# Patient Record
Sex: Female | Born: 1955 | Race: White | Hispanic: No | Marital: Married | Smoking: Never smoker
Health system: Southern US, Community
[De-identification: ages and names within clinical notes are randomized; demographics above are authoritative.]

## PROBLEM LIST (undated history)

## (undated) HISTORY — PX: NASAL SEPTUM SURGERY: SHX37

## (undated) HISTORY — PX: HERNIA REPAIR: SHX51

---

## 2014-05-23 DIAGNOSIS — I10 Essential (primary) hypertension: Secondary | ICD-10-CM | POA: Insufficient documentation

## 2015-09-09 DIAGNOSIS — S32030D Wedge compression fracture of third lumbar vertebra, subsequent encounter for fracture with routine healing: Secondary | ICD-10-CM | POA: Insufficient documentation

## 2015-10-30 DIAGNOSIS — S32000A Wedge compression fracture of unspecified lumbar vertebra, initial encounter for closed fracture: Secondary | ICD-10-CM | POA: Insufficient documentation

## 2016-05-20 DIAGNOSIS — E7841 Elevated Lipoprotein(a): Secondary | ICD-10-CM | POA: Insufficient documentation

## 2018-05-11 DIAGNOSIS — R931 Abnormal findings on diagnostic imaging of heart and coronary circulation: Secondary | ICD-10-CM | POA: Insufficient documentation

## 2018-09-21 DIAGNOSIS — M810 Age-related osteoporosis without current pathological fracture: Secondary | ICD-10-CM | POA: Insufficient documentation

## 2019-09-14 NOTE — Progress Notes (Signed)
Scheduled to complete physical 09/21/19.  (Provider TBD)  AMD 

## 2019-09-15 ENCOUNTER — Other Ambulatory Visit: Payer: Self-pay

## 2019-09-15 ENCOUNTER — Ambulatory Visit: Payer: Self-pay

## 2019-09-15 DIAGNOSIS — Z0000 Encounter for general adult medical examination without abnormal findings: Secondary | ICD-10-CM

## 2019-09-15 LAB — POCT URINALYSIS DIPSTICK
Bilirubin, UA: NEGATIVE
Blood, UA: NEGATIVE
Glucose, UA: NEGATIVE
Ketones, UA: NEGATIVE
Leukocytes, UA: NEGATIVE
Nitrite, UA: NEGATIVE
Protein, UA: NEGATIVE
Spec Grav, UA: 1.01 (ref 1.010–1.025)
Urobilinogen, UA: 0.2 E.U./dL
pH, UA: 7.5 (ref 5.0–8.0)

## 2019-09-16 LAB — CMP12+LP+TP+TSH+6AC+CBC/D/PLT
ALT: 20 IU/L (ref 0–32)
AST: 23 IU/L (ref 0–40)
Albumin/Globulin Ratio: 2 (ref 1.2–2.2)
Albumin: 4.6 g/dL (ref 3.8–4.8)
Alkaline Phosphatase: 75 IU/L (ref 39–117)
BUN/Creatinine Ratio: 18 (ref 12–28)
BUN: 14 mg/dL (ref 8–27)
Basophils Absolute: 0.1 10*3/uL (ref 0.0–0.2)
Basos: 1 %
Bilirubin Total: 0.5 mg/dL (ref 0.0–1.2)
Calcium: 9.7 mg/dL (ref 8.7–10.3)
Chloride: 102 mmol/L (ref 96–106)
Chol/HDL Ratio: 2 ratio (ref 0.0–4.4)
Cholesterol, Total: 167 mg/dL (ref 100–199)
Creatinine, Ser: 0.76 mg/dL (ref 0.57–1.00)
EOS (ABSOLUTE): 0.1 10*3/uL (ref 0.0–0.4)
Eos: 1 %
Estimated CHD Risk: <0.5 times avg. (ref 0.0–1.0)
Free Thyroxine Index: 1.6 (ref 1.2–4.9)
GFR calc Af Amer: 97 mL/min/{1.73_m2} (ref >59)
GFR calc non Af Amer: 84 mL/min/{1.73_m2} (ref >59)
GGT: 9 IU/L (ref 0–60)
Globulin, Total: 2.3 g/dL (ref 1.5–4.5)
Glucose: 85 mg/dL (ref 65–99)
HDL: 85 mg/dL (ref >39)
Hematocrit: 45.1 % (ref 34.0–46.6)
Hemoglobin: 15.3 g/dL (ref 11.1–15.9)
Immature Grans (Abs): 0 10*3/uL (ref 0.0–0.1)
Immature Granulocytes: 0 %
Iron: 101 ug/dL (ref 27–139)
LDH: 182 IU/L (ref 119–226)
LDL Chol Calc (NIH): 69 mg/dL (ref 0–99)
Lymphocytes Absolute: 1.1 10*3/uL (ref 0.7–3.1)
Lymphs: 20 %
MCH: 33.8 pg — ABNORMAL HIGH (ref 26.6–33.0)
MCHC: 33.9 g/dL (ref 31.5–35.7)
MCV: 100 fL — ABNORMAL HIGH (ref 79–97)
Monocytes Absolute: 0.4 10*3/uL (ref 0.1–0.9)
Monocytes: 7 %
Neutrophils Absolute: 4 10*3/uL (ref 1.4–7.0)
Neutrophils: 71 %
Phosphorus: 3.3 mg/dL (ref 3.0–4.3)
Platelets: 238 10*3/uL (ref 150–450)
Potassium: 4.9 mmol/L (ref 3.5–5.2)
RBC: 4.53 x10E6/uL (ref 3.77–5.28)
RDW: 11.2 % — ABNORMAL LOW (ref 11.7–15.4)
Sodium: 142 mmol/L (ref 134–144)
T3 Uptake Ratio: 26 % (ref 24–39)
T4, Total: 6 ug/dL (ref 4.5–12.0)
TSH: 1.5 u[IU]/mL (ref 0.450–4.500)
Total Protein: 6.9 g/dL (ref 6.0–8.5)
Triglycerides: 69 mg/dL (ref 0–149)
Uric Acid: 4.5 mg/dL (ref 3.0–7.2)
VLDL Cholesterol Cal: 13 mg/dL (ref 5–40)
WBC: 5.6 10*3/uL (ref 3.4–10.8)

## 2019-09-20 DIAGNOSIS — IMO0001 Reserved for inherently not codable concepts without codable children: Secondary | ICD-10-CM | POA: Insufficient documentation

## 2019-09-26 ENCOUNTER — Encounter: Payer: Self-pay | Admitting: Physician Assistant

## 2019-09-26 ENCOUNTER — Ambulatory Visit: Payer: Self-pay | Admitting: Physician Assistant

## 2019-09-26 ENCOUNTER — Other Ambulatory Visit: Payer: Self-pay

## 2019-09-26 VITALS — BP 127/89 | HR 68 | Temp 98.0°F | Resp 12 | Ht 64.0 in | Wt 123.0 lb

## 2019-09-26 DIAGNOSIS — Z0000 Encounter for general adult medical examination without abnormal findings: Secondary | ICD-10-CM

## 2019-09-26 NOTE — Progress Notes (Signed)
   Subjective: Annual physical exam    Patient ID: Brandi Roach, female    DOB: 1955/08/23, 64 y.o.   MRN: [id number]  HPI Patient presents annual physical exam.  Patient is also no concerns or complaints.   Review of Systems    Hyperlipidemia and hypertension. Objective:   Physical Exam Patient appears no acute distress.  HEENT was unremarkable.  Neck is supple without adenopathy or bruits.  Lungs are clear to auscultation.  Heart regular rate and rhythm.  Abdomen negative HSM, normoactive bowel sounds, soft, and nontender palpation.  No obvious deformity to the upper or lower extremities.  Patient has full equal range of motion upper and lower extremities.  No obvious cervical or lumbar spine deformity.  Patient has full equal range of motion cervical lumbar spine.  Cranial nerves II through XII grossly intact.     Assessment & Plan: Well exam.  Discussed patient EKG and lab results which are unremarkable.  Patient advised continue previous medication follow-up in 1 year.

## 2020-02-29 DIAGNOSIS — N8110 Cystocele, unspecified: Secondary | ICD-10-CM | POA: Insufficient documentation

## 2020-02-29 DIAGNOSIS — N952 Postmenopausal atrophic vaginitis: Secondary | ICD-10-CM | POA: Insufficient documentation

## 2021-05-22 ENCOUNTER — Other Ambulatory Visit: Payer: Self-pay

## 2021-05-22 ENCOUNTER — Ambulatory Visit
Admission: RE | Admit: 2021-05-22 | Discharge: 2021-05-22 | Disposition: A | Discharge status: Home / Self Care | Payer: Self-pay | Attending: Physician Assistant | Admitting: Physician Assistant

## 2021-05-22 ENCOUNTER — Ambulatory Visit
Admission: RE | Admit: 2021-05-22 | Discharge: 2021-05-22 | Disposition: A | Discharge status: Home / Self Care | Payer: Self-pay | Source: Ambulatory Visit | Attending: Physician Assistant | Admitting: Physician Assistant

## 2021-05-22 ENCOUNTER — Ambulatory Visit: Payer: Self-pay | Admitting: Physician Assistant

## 2021-05-22 ENCOUNTER — Encounter: Payer: Self-pay | Admitting: Physician Assistant

## 2021-05-22 VITALS — BP 175/100 | HR 86 | Temp 97.7°F | Resp 12 | Ht 64.0 in | Wt 123.0 lb

## 2021-05-22 DIAGNOSIS — M5440 Lumbago with sciatica, unspecified side: Secondary | ICD-10-CM

## 2021-05-22 MED ORDER — CYCLOBENZAPRINE HCL 5 MG PO TABS: 5.0000 mg | ORAL_TABLET | Freq: Three times a day (TID) | ORAL | 1 refills | Status: AC | PRN

## 2021-05-22 MED ORDER — MELOXICAM 7.5 MG PO TABS
7.5000 mg | ORAL_TABLET | Freq: Every day | ORAL | 0 refills | Status: AC
Start: 2021-05-22 — End: ?

## 2021-05-22 NOTE — Progress Notes (Signed)
Pt experiencing joint pain in hip. Pt believes she's having sciatica pain on left side radiating down left leg.

## 2021-05-22 NOTE — Progress Notes (Signed)
° °  Subjective: Radicular back pain    Patient ID: Brandi Roach, female    DOB: 08-20-1955, 66 y.o.   MRN: [id number]  HPI Patient presents with radicular back pain to the left lower extremity has increased in the past week.  Patient denies bladder or bowel dysfunction.  Patient has a history of osteoporosis which was treated with Reclast infusion 2 years ago.  Patient also with diagnosis of compression fracture in 2021.  Last bone density was May 2021.  Patient states complaint increased from a sitting to a standing position.  Stated numbness initially went to the posterior knee involves the total left lower extremity.  Able to ambulate with mild discomfort.  Review of Systems Essential tremors, hyperlipidemia, hypertension, and osteoporosis.    Objective:   Physical Exam  No acute distress.  See nurses note for vital signs No obvious spinal deformity.  Sits and stands without have reliance on upper extremities.  Mild guarding palpation lumbar spinal processes L3-L5.  Negative bilateral straight leg test..      Assessment & Plan: Radicular back pain.  Lidoderm patch applied prior to departure.  Patient get a prescription for 5 mg of Flexeril and 7.5 mg meloxicam.  I will obtain imagings via x-ray of the lumbar spine and follow-up in 5 days.  Advised to go to the emergency room if condition worsens.

## 2021-05-27 ENCOUNTER — Encounter: Payer: Self-pay | Admitting: Physician Assistant

## 2021-05-27 ENCOUNTER — Other Ambulatory Visit: Payer: Self-pay

## 2021-05-27 ENCOUNTER — Ambulatory Visit: Payer: Self-pay | Admitting: Physician Assistant

## 2021-05-27 VITALS — BP 145/95 | HR 90 | Temp 97.7°F | Resp 12 | Ht 64.0 in | Wt 123.0 lb

## 2021-05-27 DIAGNOSIS — M5440 Lumbago with sciatica, unspecified side: Secondary | ICD-10-CM

## 2021-05-27 NOTE — Progress Notes (Signed)
Pt presents today for follow up on si pain with xray results. Pt states the bottom of left numb/prickling. The shooting pain isn't as bad depending on how she sits.

## 2021-05-27 NOTE — Progress Notes (Signed)
° °  Subjective: Radicular back pain    Patient ID: Brandi Roach, female    DOB: [birth year], 66 y.o.   MRN: [id number]  HPI Patient here today for follow-up radicular back pain to the left lower extremity.  Patient stated pain increased when moving from a sitting to standing position or walking upstairs.  Denies bladder or bowel dysfunction.  Rates pain as a 5/10.  Patient was given a prescription for low-dose Flexeril and meloxicam.  Patient states she was afraid to take the meloxicam after reading about the side effects.  Patient states she used Flexeril after playing tennis.  Further history reveals patient has a compression fracture of L3 on L4 5 years ago.  X-rays taken last weeks shows moderate superior endplate compression fracture at L3.  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   Review of Systems Essential and whitecoat hypertension.  Essential tremors.  Compression fracture with routine healing of L3 and osteoporosis.    Objective:   Physical Exam  No acute distress.  Temperature 97.7, pulse 90, BP 145/95, respiration 12, patient weighs 123 pounds and BMI is 21.11. Patient sits and stands all have reliance on upper extremity.  Patient states that when she stands up this increase numbness sensation in the back of her leg to her feet of the left lower extremity.  Patient had a negative straight leg test in the supine position.      Assessment & Plan: Radicular back pain to left lower extremity.  Advised patient continue conservative treatment.  We will consult to orthopedics to consider physical therapy.

## 2021-06-23 ENCOUNTER — Other Ambulatory Visit: Payer: Self-pay

## 2021-06-23 ENCOUNTER — Ambulatory Visit: Payer: Self-pay

## 2021-06-23 DIAGNOSIS — Z0000 Encounter for general adult medical examination without abnormal findings: Secondary | ICD-10-CM

## 2021-06-23 LAB — POCT URINALYSIS DIPSTICK
Bilirubin, UA: NEGATIVE
Blood, UA: NEGATIVE
Clarity, UA: NEGATIVE
Glucose, UA: NEGATIVE
Ketones, UA: NEGATIVE
Leukocytes, UA: NEGATIVE
Nitrite, UA: NEGATIVE
Protein, UA: NEGATIVE
Spec Grav, UA: 1.01 (ref 1.010–1.025)
Urobilinogen, UA: 0.2 E.U./dL
pH, UA: 7.5 (ref 5.0–8.0)

## 2021-06-23 NOTE — Progress Notes (Signed)
06/30/21 annual physical scheduled.

## 2021-06-24 LAB — CMP12+LP+TP+TSH+6AC+CBC/D/PLT
ALT: 31 IU/L (ref 0–32)
AST: 28 IU/L (ref 0–40)
Albumin/Globulin Ratio: 1.9 (ref 1.2–2.2)
Albumin: 5 g/dL — ABNORMAL HIGH (ref 3.8–4.8)
Alkaline Phosphatase: 78 IU/L (ref 44–121)
BUN/Creatinine Ratio: 22 (ref 12–28)
BUN: 17 mg/dL (ref 8–27)
Basophils Absolute: 0.1 x10E3/uL (ref 0.0–0.2)
Basos: 1 %
Bilirubin Total: 0.5 mg/dL (ref 0.0–1.2)
Calcium: 9.7 mg/dL (ref 8.7–10.3)
Chloride: 102 mmol/L (ref 96–106)
Chol/HDL Ratio: 2 ratio (ref 0.0–4.4)
Cholesterol, Total: 193 mg/dL (ref 100–199)
Creatinine, Ser: 0.79 mg/dL (ref 0.57–1.00)
EOS (ABSOLUTE): 0.1 x10E3/uL (ref 0.0–0.4)
Eos: 2 %
Free Thyroxine Index: 1.5 (ref 1.2–4.9)
GGT: 9 IU/L (ref 0–60)
Globulin, Total: 2.7 g/dL (ref 1.5–4.5)
Glucose: 83 mg/dL (ref 70–99)
HDL: 95 mg/dL
Hematocrit: 45.8 % (ref 34.0–46.6)
Hemoglobin: 16 g/dL — ABNORMAL HIGH (ref 11.1–15.9)
Immature Grans (Abs): 0 x10E3/uL (ref 0.0–0.1)
Immature Granulocytes: 0 %
Iron: 86 ug/dL (ref 27–139)
LDH: 205 IU/L (ref 119–226)
LDL Chol Calc (NIH): 85 mg/dL (ref 0–99)
Lymphocytes Absolute: 1.3 x10E3/uL (ref 0.7–3.1)
Lymphs: 23 %
MCH: 33.5 pg — ABNORMAL HIGH (ref 26.6–33.0)
MCHC: 34.9 g/dL (ref 31.5–35.7)
MCV: 96 fL (ref 79–97)
Monocytes Absolute: 0.5 x10E3/uL (ref 0.1–0.9)
Monocytes: 9 %
Neutrophils Absolute: 3.8 x10E3/uL (ref 1.4–7.0)
Neutrophils: 65 %
Phosphorus: 3.4 mg/dL (ref 3.0–4.3)
Platelets: 256 x10E3/uL (ref 150–450)
Potassium: 4 mmol/L (ref 3.5–5.2)
RBC: 4.77 x10E6/uL (ref 3.77–5.28)
RDW: 11.9 % (ref 11.7–15.4)
Sodium: 143 mmol/L (ref 134–144)
T3 Uptake Ratio: 22 % — ABNORMAL LOW (ref 24–39)
T4, Total: 6.8 ug/dL (ref 4.5–12.0)
TSH: 2.06 u[IU]/mL (ref 0.450–4.500)
Total Protein: 7.7 g/dL (ref 6.0–8.5)
Triglycerides: 70 mg/dL (ref 0–149)
Uric Acid: 4.5 mg/dL (ref 3.0–7.2)
VLDL Cholesterol Cal: 13 mg/dL (ref 5–40)
WBC: 5.7 x10E3/uL (ref 3.4–10.8)
eGFR: 83 mL/min/1.73

## 2021-06-30 ENCOUNTER — Ambulatory Visit: Payer: Self-pay | Admitting: Physician Assistant

## 2021-06-30 ENCOUNTER — Other Ambulatory Visit: Payer: Self-pay

## 2021-06-30 ENCOUNTER — Encounter: Payer: Self-pay | Admitting: Physician Assistant

## 2021-06-30 VITALS — BP 130/80 | Temp 97.1°F | Resp 12 | Ht 64.0 in | Wt 129.0 lb

## 2021-06-30 DIAGNOSIS — Z0000 Encounter for general adult medical examination without abnormal findings: Secondary | ICD-10-CM

## 2021-06-30 NOTE — Progress Notes (Signed)
[facility]  ____________________________________________   None    (approximate)  I have reviewed the triage vital signs and the nursing notes.   HISTORY  Chief Complaint Annual Exam    HPI Brandi Roach is a 66 y.o. female patient presents for annual physical exam voices no concerns or complaints. Past medical history markable for osteoporosis compression fracture  L3, hypertension, and essential tremors.         No past medical history on file.  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 Authorizing Provider  atorvastatin (LIPITOR) 80 MG tablet Take 80 mg by mouth daily. 05/16/21  Yes [provider]  Cholecalciferol (VITAMIN D) 50 MCG (2000 UT) tablet Take 2,000 Units by mouth daily.   Yes [provider]  lisinopril (ZESTRIL) 5 MG tablet Take by mouth. 10/28/18  Yes [provider]  aspirin 81 MG EC tablet Take 1 tablet by mouth daily. Patient not taking: Reported on 06/30/2021 07/17/20 07/17/21  [provider]  Cholecalciferol 10 MCG (400 UNIT) CAPS  09/10/19   [provider]  cyclobenzaprine (FLEXERIL) 5 MG tablet Take 1 tablet (5 mg total) by mouth 3 (three) times daily as needed for muscle spasms. Patient not taking: Reported on 06/30/2021 05/22/21   Sable Feil, PA-C  estradiol (ESTRACE) 0.1 MG/GM vaginal cream PLACE 1 GRAM VAGINALLY 2 TIMES A WEEK 07/02/20    [provider]  fluorouracil (EFUDEX) 5 % cream Apply topically.    [provider]  meloxicam (MOBIC) 7.5 MG tablet Take 1 tablet (7.5 mg total) by mouth daily. Patient not taking: Reported on 06/30/2021 05/22/21   Sable Feil, PA-C    Allergies Patient has no known allergies.  No family history on file.  Social History Social History   Tobacco Use   Smoking status: Never   Smokeless tobacco: Never    Review of Systems Constitutional: No fever/chills Eyes: No visual changes. ENT: No sore throat. Cardiovascular: Denies chest pain. Respiratory: Denies shortness of breath. Gastrointestinal: No abdominal pain.  No nausea, no vomiting.  No diarrhea.  No constipation. Genitourinary: Negative for dysuria. Musculoskeletal: Negative for back pain. Skin: Negative for rash. Neurological: Negative for headaches, focal weakness or numbness.  Essential tremors Psychiatric:  Endocrine: Hypertension  ____________________________________________   PHYSICAL EXAM:  VITAL SIGNS: Temperature 97.1, respiration 12, BP is 130/80, and patient is 96% O2 sat on room air.  Patient weighs 129 pounds. Constitutional: Alert and oriented. Well appearing and in no acute distress. Eyes: Conjunctivae are normal. PERRL. EOMI. Head: Atraumatic. Nose: No congestion/rhinnorhea. Mouth/Throat: Mucous membranes are moist.  Oropharynx non-erythematous. Neck: No stridor.  No cervical spine tenderness to palpation. Hematological/Lymphatic/Immunilogical: No cervical lymphadenopathy. Cardiovascular: Normal rate, regular rhythm. Grossly normal heart sounds.  Good peripheral circulation. Respiratory: Normal respiratory effort.  No retractions. Lungs CTAB. Gastrointestinal: Soft and nontender. No distention. No abdominal bruits. No CVA tenderness. Genitourinary:  Musculoskeletal: No  lower extremity tenderness nor edema.  No joint effusions. Neurologic:  Normal speech and language. No gross  focal neurologic deficits are appreciated. No gait instability. Skin:  Skin is warm, dry and intact. No rash noted. Psychiatric: Mood and affect are normal. Speech and behavior are normal.  ____________________________________________   LABS _________      Component Ref Range & Units 7 d ago 1 yr ago  Color, UA  yellow VC  Light Yellow   Clarity, UA  negative  Clear   Glucose, UA Negative Negative  Negative   Bilirubin, UA  negative  Negative   Ketones, UA  negative  Negative   Spec Grav, UA 1.010 - 1.025 1.010 VC  1.010   Blood, UA  negative  Negative   pH, UA 5.0 - 8.0 7.5 VC  7.5   Protein, UA Negative Negative  Negative   Urobilinogen, UA 0.2 or 1.0 E.U./dL 0.2  0.2   Nitrite, UA  negative  Negative   Leukocytes, UA Negative Negative  Negative   Appearance  light VC     Odor             Specimen Collected: 06/23/21 08:50 Last Resulted: 06/23/21 08:50      Lab Flowsheet    Order Details    View Encounter    Lab and Collection Details    Routing    Result History    View Encounter Conversation      VC=Value has a corrected status       Result Care Coordination   Patient Communication   Add Comments   Seen Back to Top       Other Results from 06/23/2021   Contains abnormal data CMP12+LP+TP+TSH+6AC+CBC/D/Plt Order: [id number] Status: Final result    Visible to patient: Yes (seen)    Next appt: None    Dx: Routine adult health maintenance    0 Result Notes      Component Ref Range & Units 7 d ago 1 yr ago  Glucose 70 - 99 mg/dL 83  85 R   Uric Acid 3.0 - 7.2 mg/dL 4.5  4.5 CM   Comment:            Therapeutic target for gout patients: <6.0  BUN 8 - 27 mg/dL 17  14   Creatinine, Ser 0.57 - 1.00 mg/dL 0.79  0.76   eGFR >59 mL/min/1.73 83    BUN/Creatinine Ratio 12 - 28 22  18    Sodium 134 - 144 mmol/L 143  142   Potassium 3.5 - 5.2 mmol/L 4.0  4.9   Chloride 96 - 106 mmol/L 102  102   Calcium 8.7 - 10.3 mg/dL 9.7  9.7   Phosphorus 3.0 - 4.3 mg/dL 3.4   3.3   Total Protein 6.0 - 8.5 g/dL 7.7  6.9   Albumin 3.8 - 4.8 g/dL 5.0 High   4.6   Globulin, Total 1.5 - 4.5 g/dL 2.7  2.3   Albumin/Globulin Ratio 1.2 - 2.2 1.9  2.0   Bilirubin Total 0.0 - 1.2 mg/dL 0.5  0.5   Alkaline Phosphatase 44 - 121 IU/L 78  75 R   LDH 119 - 226 IU/L 205  182   AST 0 - 40 IU/L 28  23   ALT 0 - 32 IU/L 31  20   GGT 0 - 60 IU/L 9  9   Iron 27 - 139 ug/dL 86  101   Cholesterol, Total 100 - 199 mg/dL  193  167   Triglycerides 0 - 149 mg/dL 70  69   HDL >39 mg/dL 95  85   VLDL Cholesterol Cal 5 - 40 mg/dL 13  13   LDL Chol Calc (NIH) 0 - 99 mg/dL 85  69   Chol/HDL Ratio 0.0 - 4.4 ratio 2.0  2.0 CM   Comment:                                   T. Chol/HDL Ratio                                              Men  Women                                1/2 Avg.Risk  3.4    3.3                                    Avg.Risk  5.0    4.4                                 2X Avg.Risk  9.6    7.1                                 3X Avg.Risk 23.4   11.0   Estimated CHD Risk 0.0 - 1.0 times avg.  < 0.5   < 0.5 CM   Comment: The CHD Risk is based on the T. Chol/HDL ratio. Other  factors affect CHD Risk such as hypertension, smoking,  diabetes, severe obesity, and family history of  premature CHD.   TSH 0.450 - 4.500 uIU/mL 2.060  1.500   T4, Total 4.5 - 12.0 ug/dL 6.8  6.0   T3 Uptake Ratio 24 - 39 % 22 Low   26   Free Thyroxine Index 1.2 - 4.9 1.5  1.6   WBC 3.4 - 10.8 x10E3/uL 5.7  5.6   RBC 3.77 - 5.28 x10E6/uL 4.77  4.53   Hemoglobin 11.1 - 15.9 g/dL 16.0 High   15.3   Hematocrit 34.0 - 46.6 % 45.8  45.1   MCV 79 - 97 fL 96  100 High    MCH 26.6 - 33.0 pg 33.5 High   33.8 High    MCHC 31.5 - 35.7 g/dL 34.9  33.9   RDW 11.7 - 15.4 % 11.9  11.2 Low    Platelets 150 - 450 x10E3/uL 256  238   Neutrophils Not Estab. % 65  71   Lymphs Not Estab. % 23  20   Monocytes Not Estab. % 9  7   Eos Not Estab. % 2  1   Basos Not Estab. % 1  1   Neutrophils Absolute 1.4 - 7.0  x10E3/uL 3.8  4.0   Lymphocytes Absolute 0.7 - 3.1 x10E3/uL 1.3  1.1   Monocytes Absolute 0.1 - 0.9 x10E3/uL 0.5  0.4   EOS (ABSOLUTE) 0.0 - 0.4 x10E3/uL 0.1  0.1   Basophils Absolute 0.0 - 0.2  x10E3/uL 0.1  0.1   Immature Granulocytes Not Estab. % 0  0          _______________________  EKG Sinus  Rhythm  -Left atrial enlargement.   BORDERLINE No change from previous EKG reading. ____________________________________________    ____________________________________________   INITIAL IMPRESSION / ASSESSMENT AND PLAN   As part of my medical decision making, I reviewed the following data within the [facility]       Discussed lab results and EKG findings with patient.      ____________________________________________   FINAL CLINICAL IMPRESSION Well exam ED Discharge Orders     None        Note:  This document was prepared using Dragon voice recognition software and may include unintentional dictation errors.

## 2021-07-07 ENCOUNTER — Encounter: Payer: Self-pay | Admitting: Physician Assistant

## 2022-01-01 DIAGNOSIS — M7661 Achilles tendinitis, right leg: Secondary | ICD-10-CM | POA: Insufficient documentation

## 2022-07-13 ENCOUNTER — Other Ambulatory Visit: Payer: Self-pay

## 2022-07-13 DIAGNOSIS — Z0000 Encounter for general adult medical examination without abnormal findings: Secondary | ICD-10-CM

## 2022-07-13 LAB — POCT URINALYSIS DIPSTICK
Bilirubin, UA: NEGATIVE
Blood, UA: NEGATIVE
Glucose, UA: NEGATIVE
Ketones, UA: NEGATIVE
Leukocytes, UA: NEGATIVE
Nitrite, UA: NEGATIVE
Protein, UA: NEGATIVE
Spec Grav, UA: 1.01 (ref 1.010–1.025)
Urobilinogen, UA: 0.2 E.U./dL
pH, UA: 7 (ref 5.0–8.0)

## 2022-07-15 LAB — CMP12+LP+TP+TSH+6AC+CBC/D/PLT
ALT: 29 IU/L (ref 0–32)
AST: 27 IU/L (ref 0–40)
Albumin/Globulin Ratio: 2.1 (ref 1.2–2.2)
Albumin: 4.5 g/dL (ref 3.9–4.9)
Alkaline Phosphatase: 63 IU/L (ref 44–121)
BUN/Creatinine Ratio: 22 (ref 12–28)
BUN: 16 mg/dL (ref 8–27)
Basophils Absolute: 0 10*3/uL (ref 0.0–0.2)
Basos: 1 %
Bilirubin Total: 0.4 mg/dL (ref 0.0–1.2)
Calcium: 8.9 mg/dL (ref 8.7–10.3)
Chloride: 98 mmol/L (ref 96–106)
Chol/HDL Ratio: 2 ratio (ref 0.0–4.4)
Cholesterol, Total: 165 mg/dL (ref 100–199)
Creatinine, Ser: 0.73 mg/dL (ref 0.57–1.00)
EOS (ABSOLUTE): 0.1 10*3/uL (ref 0.0–0.4)
Eos: 1 %
Estimated CHD Risk: <0.5 times avg. (ref 0.0–1.0)
Free Thyroxine Index: 1.8 (ref 1.2–4.9)
GGT: 7 IU/L (ref 0–60)
Globulin, Total: 2.1 g/dL (ref 1.5–4.5)
Glucose: 94 mg/dL (ref 70–99)
HDL: 84 mg/dL (ref >39)
Hematocrit: 43.3 % (ref 34.0–46.6)
Hemoglobin: 14.7 g/dL (ref 11.1–15.9)
Immature Grans (Abs): 0 10*3/uL (ref 0.0–0.1)
Immature Granulocytes: 0 %
Iron: 94 ug/dL (ref 27–139)
LDH: 177 IU/L (ref 119–226)
LDL Chol Calc (NIH): 68 mg/dL (ref 0–99)
Lymphocytes Absolute: 1.5 10*3/uL (ref 0.7–3.1)
Lymphs: 25 %
MCH: 33.4 pg — ABNORMAL HIGH (ref 26.6–33.0)
MCHC: 33.9 g/dL (ref 31.5–35.7)
MCV: 98 fL — ABNORMAL HIGH (ref 79–97)
Monocytes Absolute: 0.6 10*3/uL (ref 0.1–0.9)
Monocytes: 10 %
Neutrophils Absolute: 3.6 10*3/uL (ref 1.4–7.0)
Neutrophils: 63 %
Phosphorus: 3.5 mg/dL (ref 3.0–4.3)
Platelets: 264 10*3/uL (ref 150–450)
Potassium: 4.1 mmol/L (ref 3.5–5.2)
RBC: 4.4 x10E6/uL (ref 3.77–5.28)
RDW: 11.3 % — ABNORMAL LOW (ref 11.7–15.4)
Sodium: 137 mmol/L (ref 134–144)
T3 Uptake Ratio: 28 % (ref 24–39)
T4, Total: 6.6 ug/dL (ref 4.5–12.0)
TSH: 2.13 u[IU]/mL (ref 0.450–4.500)
Total Protein: 6.6 g/dL (ref 6.0–8.5)
Triglycerides: 65 mg/dL (ref 0–149)
Uric Acid: 4.3 mg/dL (ref 3.0–7.2)
VLDL Cholesterol Cal: 13 mg/dL (ref 5–40)
WBC: 5.8 10*3/uL (ref 3.4–10.8)
eGFR: 91 mL/min/{1.73_m2} (ref >59)

## 2023-01-21 DIAGNOSIS — Z955 Presence of coronary angioplasty implant and graft: Secondary | ICD-10-CM | POA: Insufficient documentation

## 2023-02-18 ENCOUNTER — Encounter: Payer: 59 | Attending: Cardiovascular Disease | Admitting: *Deleted

## 2023-02-18 DIAGNOSIS — Z48812 Encounter for surgical aftercare following surgery on the circulatory system: Secondary | ICD-10-CM | POA: Insufficient documentation

## 2023-02-18 DIAGNOSIS — Z955 Presence of coronary angioplasty implant and graft: Secondary | ICD-10-CM | POA: Insufficient documentation

## 2023-02-18 NOTE — Progress Notes (Signed)
Initial phone call completed. Diagnosis can be found in [facility] 9/12. EP Orientation scheduled for Wednesday 10/16 at 10am.

## 2023-02-24 ENCOUNTER — Encounter: Payer: 59 | Admitting: *Deleted

## 2023-02-24 VITALS — Ht 64.5 in | Wt 124.0 lb

## 2023-02-24 DIAGNOSIS — Z48812 Encounter for surgical aftercare following surgery on the circulatory system: Secondary | ICD-10-CM | POA: Diagnosis not present

## 2023-02-24 DIAGNOSIS — Z955 Presence of coronary angioplasty implant and graft: Secondary | ICD-10-CM

## 2023-02-24 NOTE — Progress Notes (Signed)
Cardiac Individual Treatment Plan  Patient Details  Name: Brandi Roach MRN: [id number] Date of Birth: [birth year] Referring Provider:   Flowsheet Row Cardiac Rehab from 02/24/2023 in [facility] Cardiac and Pulmonary Rehab  Referring Provider Dr. Youlanda Mighty       Initial Encounter Date:  Flowsheet Row Cardiac Rehab from 02/24/2023 in [facility] Cardiac and Pulmonary Rehab  Date 02/24/23       Visit Diagnosis: Status post coronary artery stent placement  Patient's Home Medications on Admission:  Current Outpatient Medications:    aspirin EC 81 MG tablet, Take 1 tablet by mouth daily., Disp: , Rfl:    atorvastatin (LIPITOR) 80 MG tablet, Take 80 mg by mouth daily., Disp: , Rfl:    Cholecalciferol (VITAMIN D) 50 MCG (2000 UT) tablet, Take 2,000 Units by mouth daily., Disp: , Rfl:    Cholecalciferol 10 MCG (400 UNIT) CAPS, Take 400 Units by mouth daily., Disp: , Rfl:    clopidogrel (PLAVIX) 75 MG tablet, Take 75 mg by mouth daily., Disp: , Rfl:    cyclobenzaprine (FLEXERIL) 5 MG tablet, Take 1 tablet (5 mg total) by mouth 3 (three) times daily as needed for muscle spasms. (Patient not taking: Reported on 06/30/2021), Disp: 30 tablet, Rfl: 1   denosumab (PROLIA) 60 MG/ML SOSY injection, Inject 60 mg into the skin once., Disp: , Rfl:    estradiol (ESTRACE) 0.1 MG/GM vaginal cream, PLACE 1 GRAM VAGINALLY 2 TIMES A WEEK, Disp: , Rfl:    ezetimibe (ZETIA) 10 MG tablet, Take 1 tablet by mouth daily., Disp: , Rfl:    fluorouracil (EFUDEX) 5 % cream, Apply topically., Disp: , Rfl:    lisinopril (ZESTRIL) 5 MG tablet, Take by mouth. (Patient not taking: Reported on 02/18/2023), Disp: , Rfl:    losartan (COZAAR) 100 MG tablet, Take 100 mg by mouth daily., Disp: , Rfl:    meloxicam (MOBIC) 7.5 MG tablet, Take 1 tablet (7.5 mg total) by mouth daily. (Patient not taking: Reported on 06/30/2021), Disp: 10 tablet, Rfl: 0  Past Medical History: No past medical history on file.  Tobacco Use: Social History    Tobacco Use  Smoking Status Never  Smokeless Tobacco Never    Labs: Review Flowsheet       Latest Ref Rng & Units 09/15/2019 06/23/2021 07/13/2022  Labs for ITP Cardiac and Pulmonary Rehab  Cholestrol 100 - 199 mg/dL 829  562  130   LDL (calc) 0 - 99 mg/dL 69  85  68   HDL-C >86 mg/dL 85  95  84   Trlycerides 0 - 149 mg/dL 69  70  65     Details             Exercise Target Goals: Exercise Program Goal: Individual exercise prescription set using results from initial 6 min walk test and THRR while considering  patient's activity barriers and safety.   Exercise Prescription Goal: Initial exercise prescription builds to 30-45 minutes a day of aerobic activity, 2-3 days per week.  Home exercise guidelines will be given to patient during program as part of exercise prescription that the participant will acknowledge.   Education: Aerobic Exercise: - Group verbal and visual presentation on the components of exercise prescription. Introduces F.I.T.T principle from ACSM for exercise prescriptions.  Reviews F.I.T.T. principles of aerobic exercise including progression. Written material given at graduation.   Education: Resistance Exercise: - Group verbal and visual presentation on the components of exercise prescription. Introduces F.I.T.T principle from ACSM for exercise prescriptions  Reviews F.I.T.T. principles of resistance exercise including progression. Written material given at graduation.    Education: Exercise & Equipment Safety: - Individual verbal instruction and demonstration of equipment use and safety with use of the equipment. Flowsheet Row Cardiac Rehab from 02/24/2023 in [facility] Cardiac and Pulmonary Rehab  Date 02/24/23  Educator [facility]  Instruction Review Code 1- Verbalizes Understanding       Education: Exercise Physiology & General Exercise Guidelines: - Group verbal and written instruction with models to review the exercise physiology of the cardiovascular system  and associated critical values. Provides general exercise guidelines with specific guidelines to those with heart or lung disease.  Flowsheet Row Cardiac Rehab from 02/24/2023 in [facility] Cardiac and Pulmonary Rehab  Education need identified 02/24/23       Education: Flexibility, Balance, Mind/Body Relaxation: - Group verbal and visual presentation with interactive activity on the components of exercise prescription. Introduces F.I.T.T principle from ACSM for exercise prescriptions. Reviews F.I.T.T. principles of flexibility and balance exercise training including progression. Also discusses the mind body connection.  Reviews various relaxation techniques to help reduce and manage stress (i.e. Deep breathing, progressive muscle relaxation, and visualization). Balance handout provided to take home. Written material given at graduation.   Activity Barriers & Risk Stratification:  Activity Barriers & Cardiac Risk Stratification - 02/18/23 1432       Activity Barriers & Cardiac Risk Stratification   Activity Barriers None    Cardiac Risk Stratification Moderate             6 Minute Walk:  6 Minute Walk     Row Name 02/24/23 1140         6 Minute Walk   Phase Initial     Distance 1660 feet     Walk Time 6 minutes     # of Rest Breaks 0     MPH 3.14     METS 4.14     RPE 12     Perceived Dyspnea  0     VO2 Peak 14.5     Symptoms No     Resting HR 61 bpm     Resting BP 140/82     Resting Oxygen Saturation  98 %     Exercise Oxygen Saturation  during 6 min walk 95 %     Max Ex. HR 102 bpm     Max Ex. BP 148/82     2 Minute Post BP 124/72              Oxygen Initial Assessment:   Oxygen Re-Evaluation:   Oxygen Discharge (Final Oxygen Re-Evaluation):   Initial Exercise Prescription:  Initial Exercise Prescription - 02/24/23 1100       Date of Initial Exercise RX and Referring Provider   Date 02/24/23    Referring Provider Dr. Youlanda Mighty      Oxygen    Maintain Oxygen Saturation 88% or higher      Treadmill   MPH 3.5    Grade 1    Minutes 15    METs 4.16      Elliptical   Level 1    Speed 3    Minutes 15    METs 4.14      REL-XR   Level 3    Speed 50    Minutes 15    METs 4.14      Prescription Details   Frequency (times per week) 3    Duration Progress to 30 minutes of continuous  aerobic without signs/symptoms of physical distress      Intensity   THRR 40-80% of Max Heartrate 97-134    Ratings of Perceived Exertion 11-13    Perceived Dyspnea 0-4      Progression   Progression Continue to progress workloads to maintain intensity without signs/symptoms of physical distress.      Resistance Training   Training Prescription Yes    Weight 7    Reps 10-15             Perform Capillary Blood Glucose checks as needed.  Exercise Prescription Changes:   Exercise Prescription Changes     Row Name 02/24/23 1100             Response to Exercise   Blood Pressure (Admit) 140/82       Blood Pressure (Exercise) 148/82       Blood Pressure (Exit) 124/72       Heart Rate (Admit) 61 bpm       Heart Rate (Exercise) 102 bpm       Heart Rate (Exit) 70 bpm       Oxygen Saturation (Admit) 98 %       Oxygen Saturation (Exercise) 95 %       Oxygen Saturation (Exit) 98 %       Rating of Perceived Exertion (Exercise) 12       Perceived Dyspnea (Exercise) 0       Symptoms none       Comments 6 MWT results                Exercise Comments:   Exercise Goals and Review:   Exercise Goals     Row Name 02/24/23 1154             Exercise Goals   Increase Physical Activity Yes       Intervention Provide advice, education, support and counseling about physical activity/exercise needs.;Develop an individualized exercise prescription for aerobic and resistive training based on initial evaluation findings, risk stratification, comorbidities and participant's personal goals.       Expected Outcomes Short Term:  Attend rehab on a regular basis to increase amount of physical activity.;Long Term: Add in home exercise to make exercise part of routine and to increase amount of physical activity.;Long Term: Exercising regularly at least 3-5 days a week.       Increase Strength and Stamina Yes       Intervention Provide advice, education, support and counseling about physical activity/exercise needs.;Develop an individualized exercise prescription for aerobic and resistive training based on initial evaluation findings, risk stratification, comorbidities and participant's personal goals.       Expected Outcomes Short Term: Increase workloads from initial exercise prescription for resistance, speed, and METs.;Short Term: Perform resistance training exercises routinely during rehab and add in resistance training at home;Long Term: Improve cardiorespiratory fitness, muscular endurance and strength as measured by increased METs and functional capacity ( )       Able to understand and use rate of perceived exertion (RPE) scale Yes       Intervention Provide education and explanation on how to use RPE scale       Expected Outcomes Short Term: Able to use RPE daily in rehab to express subjective intensity level;Long Term:  Able to use RPE to guide intensity level when exercising independently       Able to understand and use Dyspnea scale Yes       Intervention Provide education and explanation  on how to use Dyspnea scale       Expected Outcomes Short Term: Able to use Dyspnea scale daily in rehab to express subjective sense of shortness of breath during exertion;Long Term: Able to use Dyspnea scale to guide intensity level when exercising independently       Knowledge and understanding of Target Heart Rate Range (THRR) Yes       Intervention Provide education and explanation of THRR including how the numbers were predicted and where they are located for reference       Expected Outcomes Short Term: Able to state/look up  THRR;Long Term: Able to use THRR to govern intensity when exercising independently;Short Term: Able to use daily as guideline for intensity in rehab       Able to check pulse independently Yes       Intervention Provide education and demonstration on how to check pulse in carotid and radial arteries.;Review the importance of being able to check your own pulse for safety during independent exercise       Expected Outcomes Short Term: Able to explain why pulse checking is important during independent exercise;Long Term: Able to check pulse independently and accurately       Understanding of Exercise Prescription Yes       Intervention Provide education, explanation, and written materials on patient's individual exercise prescription       Expected Outcomes Short Term: Able to explain program exercise prescription;Long Term: Able to explain home exercise prescription to exercise independently                Exercise Goals Re-Evaluation :   Discharge Exercise Prescription (Final Exercise Prescription Changes):  Exercise Prescription Changes - 02/24/23 1100       Response to Exercise   Blood Pressure (Admit) 140/82    Blood Pressure (Exercise) 148/82    Blood Pressure (Exit) 124/72    Heart Rate (Admit) 61 bpm    Heart Rate (Exercise) 102 bpm    Heart Rate (Exit) 70 bpm    Oxygen Saturation (Admit) 98 %    Oxygen Saturation (Exercise) 95 %    Oxygen Saturation (Exit) 98 %    Rating of Perceived Exertion (Exercise) 12    Perceived Dyspnea (Exercise) 0    Symptoms none    Comments 6 MWT results             Nutrition:  Target Goals: Understanding of nutrition guidelines, daily intake of sodium 1500mg , cholesterol 200mg , calories 30% from fat and 7% or less from saturated fats, daily to have 5 or more servings of fruits and vegetables.  Education: All About Nutrition: -Group instruction provided by verbal, written material, interactive activities, discussions, models, and  posters to present general guidelines for heart healthy nutrition including fat, fiber, MyPlate, the role of sodium in heart healthy nutrition, utilization of the nutrition label, and utilization of this knowledge for meal planning. Follow up email sent as well. Written material given at graduation.   Biometrics:  Pre Biometrics - 02/24/23 1156       Pre Biometrics   Height 5' 4.5" (1.638 m)    Weight 124 lb (56.2 kg)    Waist Circumference 30.5 inches    Hip Circumference 37 inches    Waist to Hip Ratio 0.82 %    BMI (Calculated) 20.96    Single Leg Stand 30 seconds              Nutrition Therapy Plan and Nutrition Goals:  Nutrition Assessments:  MEDIFICTS Score Key: >=70 Need to make dietary changes  40-70 Heart Healthy Diet <= 40 Therapeutic Level Cholesterol Diet  Flowsheet Row Cardiac Rehab from 02/18/2023 in [facility] Cardiac and Pulmonary Rehab  Picture Your Plate Total Score on Admission 85      Picture Your Plate Scores: <16 Unhealthy dietary pattern with much room for improvement. 41-50 Dietary pattern unlikely to meet recommendations for good health and room for improvement. 51-60 More healthful dietary pattern, with some room for improvement.  >60 Healthy dietary pattern, although there may be some specific behaviors that could be improved.    Nutrition Goals Re-Evaluation:   Nutrition Goals Discharge (Final Nutrition Goals Re-Evaluation):   Psychosocial: Target Goals: Acknowledge presence or absence of significant depression and/or stress, maximize coping skills, provide positive support system. Participant is able to verbalize types and ability to use techniques and skills needed for reducing stress and depression.   Education: Stress, Anxiety, and Depression - Group verbal and visual presentation to define topics covered.  Reviews how body is impacted by stress, anxiety, and depression.  Also discusses healthy ways to reduce stress and to treat/manage  anxiety and depression.  Written material given at graduation.   Education: Sleep Hygiene -Provides group verbal and written instruction about how sleep can affect your health.  Define sleep hygiene, discuss sleep cycles and impact of sleep habits. Review good sleep hygiene tips.    Initial Review & Psychosocial Screening:  Initial Psych Review & Screening - 02/18/23 1437       Initial Review   Current issues with Current Stress Concerns    Source of Stress Concerns Financial      Family Dynamics   Good Support System? Yes      Screening Interventions   Interventions Encouraged to exercise;Provide feedback about the scores to participant;To provide support and resources with identified psychosocial needs    Expected Outcomes Long Term Goal: Stressors or current issues are controlled or eliminated.;Short Term goal: Utilizing psychosocial counselor, staff and physician to assist with identification of specific Stressors or current issues interfering with healing process. Setting desired goal for each stressor or current issue identified.;Short Term goal: Identification and review with participant of any Quality of Life or Depression concerns found by scoring the questionnaire.;Long Term goal: The participant improves quality of Life and PHQ9 Scores as seen by post scores and/or verbalization of changes             Quality of Life Scores:   Quality of Life - 02/18/23 1503       Quality of Life   Select Quality of Life      Quality of Life Scores   Health/Function Pre 24.5 %    Socioeconomic Pre 23.06 %    Psych/Spiritual Pre 22.5 %    Family Pre 27.6 %    GLOBAL Pre 24.21 %            Scores of 19 and below usually indicate a poorer quality of life in these areas.  A difference of  2-3 points is a clinically meaningful difference.  A difference of 2-3 points in the total score of the Quality of Life Index has been associated with significant improvement in overall quality  of life, self-image, physical symptoms, and general health in studies assessing change in quality of life.  PHQ-9: Review Flowsheet       02/24/2023  Depression screen PHQ 2/9  Decreased Interest 0  Down, Depressed, Hopeless 0  PHQ -  2 Score 0  Altered sleeping 0  Tired, decreased energy 0  Change in appetite 0  Feeling bad or failure about yourself  0  Trouble concentrating 0  Moving slowly or fidgety/restless 0  Suicidal thoughts 0  PHQ-9 Score 0  Difficult doing work/chores Not difficult at all    Details           Interpretation of Total Score  Total Score Depression Severity:  1-4 = Minimal depression, 5-9 = Mild depression, 10-14 = Moderate depression, 15-19 = Moderately severe depression, 20-27 = Severe depression   Psychosocial Evaluation and Intervention:  Psychosocial Evaluation - 02/18/23 1452       Psychosocial Evaluation & Interventions   Interventions Encouraged to exercise with the program and follow exercise prescription;Stress management education;Relaxation education    Comments Alianys is coming to cardiac rehab after a stent placement. She feels like she is recovering well. She is usually a very active person who enjoys walking and especially swimming. She was instructed not to return to those activities at her previous pace until she starts cardiac rehab, so she is very ready to start the program. When asked about her mental health, she states that there is some stress related to whether or not she should retire and the financial involvement. She has a great support system.    Expected Outcomes Short: attend cardiac rehab for education and exercise. Long: Develop and maintain positive self care habits    Continue Psychosocial Services  Follow up required by staff             Psychosocial Re-Evaluation:   Psychosocial Discharge (Final Psychosocial Re-Evaluation):   Vocational Rehabilitation: Provide vocational rehab assistance to qualifying  candidates.   Vocational Rehab Evaluation & Intervention:  Vocational Rehab - 02/18/23 1437       Initial Vocational Rehab Evaluation & Intervention   Assessment shows need for Vocational Rehabilitation No             Education: Education Goals: Education classes will be provided on a variety of topics geared toward better understanding of heart health and risk factor modification. Participant will state understanding/return demonstration of topics presented as noted by education test scores.  Learning Barriers/Preferences:  Learning Barriers/Preferences - 02/18/23 1437       Learning Barriers/Preferences   Learning Barriers None    Learning Preferences None             General Cardiac Education Topics:  AED/CPR: - Group verbal and written instruction with the use of models to demonstrate the basic use of the AED with the basic ABC's of resuscitation.   Anatomy and Cardiac Procedures: - Group verbal and visual presentation and models provide information about basic cardiac anatomy and function. Reviews the testing methods done to diagnose heart disease and the outcomes of the test results. Describes the treatment choices: Medical Management, Angioplasty, or Coronary Bypass Surgery for treating various heart conditions including Myocardial Infarction, Angina, Valve Disease, and Cardiac Arrhythmias.  Written material given at graduation.   Medication Safety: - Group verbal and visual instruction to review commonly prescribed medications for heart and lung disease. Reviews the medication, class of the drug, and side effects. Includes the steps to properly store meds and maintain the prescription regimen.  Written material given at graduation.   Intimacy: - Group verbal instruction through game format to discuss how heart and lung disease can affect sexual intimacy. Written material given at graduation..   Know Your Numbers and Heart Failure: -  Group verbal and visual  instruction to discuss disease risk factors for cardiac and pulmonary disease and treatment options.  Reviews associated critical values for Overweight/Obesity, Hypertension, Cholesterol, and Diabetes.  Discusses basics of heart failure: signs/symptoms and treatments.  Introduces Heart Failure Zone chart for action plan for heart failure.  Written material given at graduation. Flowsheet Row Cardiac Rehab from 02/24/2023 in [facility] Cardiac and Pulmonary Rehab  Education need identified 02/24/23       Infection Prevention: - Provides verbal and written material to individual with discussion of infection control including proper hand washing and proper equipment cleaning during exercise session. Flowsheet Row Cardiac Rehab from 02/24/2023 in [facility] Cardiac and Pulmonary Rehab  Date 02/24/23  Educator [facility]  Instruction Review Code 1- Verbalizes Understanding       Falls Prevention: - Provides verbal and written material to individual with discussion of falls prevention and safety. Flowsheet Row Cardiac Rehab from 02/24/2023 in [facility] Cardiac and Pulmonary Rehab  Date 02/24/23  Educator [facility]  Instruction Review Code 1- Verbalizes Understanding       Other: -Provides group and verbal instruction on various topics (see comments)   Knowledge Questionnaire Score:  Knowledge Questionnaire Score - 02/18/23 1503       Knowledge Questionnaire Score   Pre Score 24/26             Core Components/Risk Factors/Patient Goals at Admission:  Personal Goals and Risk Factors at Admission - 02/24/23 1157       Core Components/Risk Factors/Patient Goals on Admission    Weight Management Yes    Intervention Weight Management: Develop a combined nutrition and exercise program designed to reach desired caloric intake, while maintaining appropriate intake of nutrient and fiber, sodium and fats, and appropriate energy expenditure required for the weight goal.;Weight Management: Provide education and  appropriate resources to help participant work on and attain dietary goals.;Weight Management/Obesity: Establish reasonable short term and long term weight goals.    Admit Weight 124 lb (56.2 kg)    Goal Weight: Short Term 124 lb (56.2 kg)    Goal Weight: Long Term 124 lb (56.2 kg)    Expected Outcomes Short Term: Continue to assess and modify interventions until short term weight is achieved;Long Term: Adherence to nutrition and physical activity/exercise program aimed toward attainment of established weight goal;Weight Maintenance: Understanding of the daily nutrition guidelines, which includes 25-35% calories from fat, 7% or less cal from saturated fats, less than 200mg  cholesterol, less than 1.5gm of sodium, & 5 or more servings of fruits and vegetables daily;Understanding recommendations for meals to include 15-35% energy as protein, 25-35% energy from fat, 35-60% energy from carbohydrates, less than 200mg  of dietary cholesterol, 20-35 gm of total fiber daily;Understanding of distribution of calorie intake throughout the day with the consumption of 4-5 meals/snacks    Hypertension Yes    Intervention Provide education on lifestyle modifcations including regular physical activity/exercise, weight management, moderate sodium restriction and increased consumption of fresh fruit, vegetables, and low fat dairy, alcohol moderation, and smoking cessation.;Monitor prescription use compliance.    Expected Outcomes Short Term: Continued assessment and intervention until BP is < 140/6mm HG in hypertensive participants. < 130/32mm HG in hypertensive participants with diabetes, heart failure or chronic kidney disease.;Long Term: Maintenance of blood pressure at goal levels.    Lipids Yes    Intervention Provide education and support for participant on nutrition & aerobic/resistive exercise along with prescribed medications to achieve LDL 70mg , HDL >40mg .    Expected Outcomes  Short Term: Participant states  understanding of desired cholesterol values and is compliant with medications prescribed. Participant is following exercise prescription and nutrition guidelines.;Long Term: Cholesterol controlled with medications as prescribed, with individualized exercise RX and with personalized nutrition plan. Value goals: LDL < 70mg , HDL > 40 mg.             Education:Diabetes - Individual verbal and written instruction to review signs/symptoms of diabetes, desired ranges of glucose level fasting, after meals and with exercise. Acknowledge that pre and post exercise glucose checks will be done for 3 sessions at entry of program.   Core Components/Risk Factors/Patient Goals Review:    Core Components/Risk Factors/Patient Goals at Discharge (Final Review):    ITP Comments:  ITP Comments     Row Name 02/18/23 1446 02/24/23 1139         ITP Comments Initial phone call completed. Diagnosis can be found in [facility] 9/12. EP Orientation scheduled for Wednesday 10/16 at 10am. Completed and gym orientation. Initial ITP created and sent for review to Dr. Bethann Punches, Medical Director.               Comments: initial ITP

## 2023-02-24 NOTE — Progress Notes (Signed)
Assessment start time: 11:01 AM  Digestive issues/concerns: no known food allergies   24-hours Recall: B: bran flakes, fiber one, walnuts, pumpkin seeds, silk soy milk L: almond butter sandwich on dave killer bread, apple, grapes, cheese stick, celery, baby carrots D: beans and rice, cheese  Beverages water(40-50oz), coffee Alcohol none Caffeine coffee  Supplements Vitamin D Intake Patterns: vegetarian, has healthy snacks. Does feel she has habitual snacking after work   Education r/t nutrition plan Patient drinking 45oz of water, set goal to aim for ~64oz daily. She is vegetarian and has structured eating patterns. She eats lots of fruits and veggies, includes good sources of plant based protein as well as some dairy and eggs in moderation. Reviewed Mediterranean diet handout, types of fats, sources, and how to read labels. Went over a few fact labels as well, educated on ways to keep sodium intake less than 2300mg . Built out meals and snacks focused on meeting needs with vegetarian friendly foods that she likes and will eat.    Goal 1: Drink 64oz of water Goal 2: Continue to build balanced plates and meet nutrition goals Goal 3: When snacking ask why.  End time 11:43 AM

## 2023-02-24 NOTE — Patient Instructions (Signed)
Patient Instructions  Patient Details  Name: Brandi Roach MRN: [id number] Date of Birth: February 04, 1956 Referring Provider:  Francesco Runner, MD  Below are your personal goals for exercise, nutrition, and risk factors. Our goal is to help you stay on track towards obtaining and maintaining these goals. We will be discussing your progress on these goals with you throughout the program.  Initial Exercise Prescription:  Initial Exercise Prescription - 02/24/23 1100       Date of Initial Exercise RX and Referring Provider   Date 02/24/23    Referring Provider Dr. Youlanda Mighty      Oxygen   Maintain Oxygen Saturation 88% or higher      Treadmill   MPH 3.5    Grade 1    Minutes 15    METs 4.16      Elliptical   Level 1    Speed 3    Minutes 15    METs 4.14      REL-XR   Level 3    Speed 50    Minutes 15    METs 4.14      Prescription Details   Frequency (times per week) 3    Duration Progress to 30 minutes of continuous aerobic without signs/symptoms of physical distress      Intensity   THRR 40-80% of Max Heartrate 97-134    Ratings of Perceived Exertion 11-13    Perceived Dyspnea 0-4      Progression   Progression Continue to progress workloads to maintain intensity without signs/symptoms of physical distress.      Resistance Training   Training Prescription Yes    Weight 7    Reps 10-15             Exercise Goals: Frequency: Be able to perform aerobic exercise two to three times per week in program working toward 2-5 days per week of home exercise.  Intensity: Work with a perceived exertion of 11 (fairly light) - 15 (hard) while following your exercise prescription.  We will make changes to your prescription with you as you progress through the program.   Duration: Be able to do 30 to 45 minutes of continuous aerobic exercise in addition to a 5 minute warm-up and a 5 minute cool-down routine.   Nutrition Goals: Your personal nutrition goals will be  established when you do your nutrition analysis with the dietician.  The following are general nutrition guidelines to follow: Cholesterol < 200mg /day Sodium < 1500mg /day Fiber: Women over 50 yrs - 21 grams per day  Personal Goals:  Personal Goals and Risk Factors at Admission - 02/24/23 1157       Core Components/Risk Factors/Patient Goals on Admission    Weight Management Yes    Intervention Weight Management: Develop a combined nutrition and exercise program designed to reach desired caloric intake, while maintaining appropriate intake of nutrient and fiber, sodium and fats, and appropriate energy expenditure required for the weight goal.;Weight Management: Provide education and appropriate resources to help participant work on and attain dietary goals.;Weight Management/Obesity: Establish reasonable short term and long term weight goals.    Admit Weight 124 lb (56.2 kg)    Goal Weight: Short Term 124 lb (56.2 kg)    Goal Weight: Long Term 124 lb (56.2 kg)    Expected Outcomes Short Term: Continue to assess and modify interventions until short term weight is achieved;Long Term: Adherence to nutrition and physical activity/exercise program aimed toward attainment of established weight goal;Weight Maintenance: Understanding  of the daily nutrition guidelines, which includes 25-35% calories from fat, 7% or less cal from saturated fats, less than 200mg  cholesterol, less than 1.5gm of sodium, & 5 or more servings of fruits and vegetables daily;Understanding recommendations for meals to include 15-35% energy as protein, 25-35% energy from fat, 35-60% energy from carbohydrates, less than 200mg  of dietary cholesterol, 20-35 gm of total fiber daily;Understanding of distribution of calorie intake throughout the day with the consumption of 4-5 meals/snacks    Hypertension Yes    Intervention Provide education on lifestyle modifcations including regular physical activity/exercise, weight management,  moderate sodium restriction and increased consumption of fresh fruit, vegetables, and low fat dairy, alcohol moderation, and smoking cessation.;Monitor prescription use compliance.    Expected Outcomes Short Term: Continued assessment and intervention until BP is < 140/28mm HG in hypertensive participants. < 130/6mm HG in hypertensive participants with diabetes, heart failure or chronic kidney disease.;Long Term: Maintenance of blood pressure at goal levels.    Lipids Yes    Intervention Provide education and support for participant on nutrition & aerobic/resistive exercise along with prescribed medications to achieve LDL 70mg , HDL >40mg .    Expected Outcomes Short Term: Participant states understanding of desired cholesterol values and is compliant with medications prescribed. Participant is following exercise prescription and nutrition guidelines.;Long Term: Cholesterol controlled with medications as prescribed, with individualized exercise RX and with personalized nutrition plan. Value goals: LDL < 70mg , HDL > 40 mg.             Tobacco Use Initial Evaluation: Social History   Tobacco Use  Smoking Status Never  Smokeless Tobacco Never    Exercise Goals and Review:  Exercise Goals     Row Name 02/24/23 1154             Exercise Goals   Increase Physical Activity Yes       Intervention Provide advice, education, support and counseling about physical activity/exercise needs.;Develop an individualized exercise prescription for aerobic and resistive training based on initial evaluation findings, risk stratification, comorbidities and participant's personal goals.       Expected Outcomes Short Term: Attend rehab on a regular basis to increase amount of physical activity.;Long Term: Add in home exercise to make exercise part of routine and to increase amount of physical activity.;Long Term: Exercising regularly at least 3-5 days a week.       Increase Strength and Stamina Yes        Intervention Provide advice, education, support and counseling about physical activity/exercise needs.;Develop an individualized exercise prescription for aerobic and resistive training based on initial evaluation findings, risk stratification, comorbidities and participant's personal goals.       Expected Outcomes Short Term: Increase workloads from initial exercise prescription for resistance, speed, and METs.;Short Term: Perform resistance training exercises routinely during rehab and add in resistance training at home;Long Term: Improve cardiorespiratory fitness, muscular endurance and strength as measured by increased METs and functional capacity ( )       Able to understand and use rate of perceived exertion (RPE) scale Yes       Intervention Provide education and explanation on how to use RPE scale       Expected Outcomes Short Term: Able to use RPE daily in rehab to express subjective intensity level;Long Term:  Able to use RPE to guide intensity level when exercising independently       Able to understand and use Dyspnea scale Yes       Intervention Provide education and  explanation on how to use Dyspnea scale       Expected Outcomes Short Term: Able to use Dyspnea scale daily in rehab to express subjective sense of shortness of breath during exertion;Long Term: Able to use Dyspnea scale to guide intensity level when exercising independently       Knowledge and understanding of Target Heart Rate Range (THRR) Yes       Intervention Provide education and explanation of THRR including how the numbers were predicted and where they are located for reference       Expected Outcomes Short Term: Able to state/look up THRR;Long Term: Able to use THRR to govern intensity when exercising independently;Short Term: Able to use daily as guideline for intensity in rehab       Able to check pulse independently Yes       Intervention Provide education and demonstration on how to check pulse in carotid and  radial arteries.;Review the importance of being able to check your own pulse for safety during independent exercise       Expected Outcomes Short Term: Able to explain why pulse checking is important during independent exercise;Long Term: Able to check pulse independently and accurately       Understanding of Exercise Prescription Yes       Intervention Provide education, explanation, and written materials on patient's individual exercise prescription       Expected Outcomes Short Term: Able to explain program exercise prescription;Long Term: Able to explain home exercise prescription to exercise independently                Copy of goals given to participant.

## 2023-03-01 ENCOUNTER — Encounter: Payer: 59 | Admitting: *Deleted

## 2023-03-01 DIAGNOSIS — Z955 Presence of coronary angioplasty implant and graft: Secondary | ICD-10-CM

## 2023-03-01 NOTE — Progress Notes (Signed)
Daily Session Note  Patient Details  Name: Brandi Roach MRN: [id number] Date of Birth: 05/29/1955 Referring Provider:   Flowsheet Row Cardiac Rehab from 02/24/2023 in [facility] Cardiac and Pulmonary Rehab  Referring Provider Dr. Youlanda Mighty       Encounter Date: 03/01/2023  Check In:  Session Check In - 03/01/23 0820       Check-In   Supervising physician immediately available to respond to emergencies See telemetry face sheet for immediately available ER MD    Location [facility]-Cardiac & Pulmonary Rehab    Staff Present Cora Collum, RN, BSN, CCRP;Jason Wallace [place], RDN, LDN;Joseph [place], RCP,RRT,BSRT;Kelly [place], BS, ACSM CEP, Exercise Physiologist    Virtual Visit No    Medication changes reported     No    Fall or balance concerns reported    No    Warm-up and Cool-down Performed on first and last piece of equipment    Resistance Training Performed Yes    VAD Patient? No    PAD/SET Patient? No      Pain Assessment   Currently in Pain? No/denies                Social History   Tobacco Use  Smoking Status Never  Smokeless Tobacco Never    Goals Met:  Independence with exercise equipment Exercise tolerated well No report of concerns or symptoms today  Goals Unmet:  Not Applicable  Comments: First full day of exercise!  Patient was oriented to gym and equipment including functions, settings, policies, and procedures.  Patient's individual exercise prescription and treatment plan were reviewed.  All starting workloads were established based on the results of the 6 minute walk test done at initial orientation visit.  The plan for exercise progression was also introduced and progression will be customized based on patient's performance and goals.    Dr. Bethann Punches is Medical Director for [facility] Cardiac Rehabilitation.  Dr. Vida Rigger is Medical Director for [facility] Pulmonary Rehabilitation.

## 2023-03-03 ENCOUNTER — Encounter: Payer: 59 | Admitting: *Deleted

## 2023-03-03 DIAGNOSIS — Z955 Presence of coronary angioplasty implant and graft: Secondary | ICD-10-CM

## 2023-03-03 NOTE — Progress Notes (Signed)
Daily Session Note  Patient Details  Name: Brandi Roach MRN: [id number] Date of Birth: 06/12/1955 Referring Provider:   Flowsheet Row Cardiac Rehab from 02/24/2023 in [facility] Cardiac and Pulmonary Rehab  Referring Provider Dr. Youlanda Mighty       Encounter Date: 03/03/2023  Check In:  Session Check In - 03/03/23 0807       Check-In   Supervising physician immediately available to respond to emergencies See telemetry face sheet for immediately available ER MD    Location [facility]-Cardiac & Pulmonary Rehab    Staff Present Rory Percy, MS, Exercise Physiologist;Joseph Reino Kent, RCP,RRT,BSRT;Maxon Upland BS, , Exercise Physiologist;Meril Dray Katrinka Blazing, RN, ADN    Virtual Visit No    Medication changes reported     No    Fall or balance concerns reported    No    Warm-up and Cool-down Performed on first and last piece of equipment    Resistance Training Performed Yes    VAD Patient? No    PAD/SET Patient? No      Pain Assessment   Currently in Pain? No/denies                Social History   Tobacco Use  Smoking Status Never  Smokeless Tobacco Never    Goals Met:  Independence with exercise equipment Exercise tolerated well No report of concerns or symptoms today Strength training completed today  Goals Unmet:  Not Applicable  Comments: Pt able to follow exercise prescription today without complaint.  Will continue to monitor for progression.    Dr. Bethann Punches is Medical Director for [facility] Cardiac Rehabilitation.  Dr. Vida Rigger is Medical Director for [facility] Pulmonary Rehabilitation.

## 2023-03-10 ENCOUNTER — Encounter: Payer: Self-pay | Admitting: *Deleted

## 2023-03-10 DIAGNOSIS — Z955 Presence of coronary angioplasty implant and graft: Secondary | ICD-10-CM

## 2023-03-10 NOTE — Progress Notes (Signed)
Cardiac Individual Treatment Plan  Patient Details  Name: Brandi Roach MRN: [id number] Date of Birth: 1955/05/19 Referring Provider:   Flowsheet Row Cardiac Rehab from 02/24/2023 in [facility] Cardiac and Pulmonary Rehab  Referring Provider Dr. Youlanda Mighty       Initial Encounter Date:  Flowsheet Row Cardiac Rehab from 02/24/2023 in [facility] Cardiac and Pulmonary Rehab  Date 02/24/23       Visit Diagnosis: Status post coronary artery stent placement  Patient's Home Medications on Admission:  Current Outpatient Medications:    aspirin EC 81 MG tablet, Take 1 tablet by mouth daily., Disp: , Rfl:    atorvastatin (LIPITOR) 80 MG tablet, Take 80 mg by mouth daily., Disp: , Rfl:    Cholecalciferol (VITAMIN D) 50 MCG (2000 UT) tablet, Take 2,000 Units by mouth daily., Disp: , Rfl:    Cholecalciferol 10 MCG (400 UNIT) CAPS, Take 400 Units by mouth daily., Disp: , Rfl:    clopidogrel (PLAVIX) 75 MG tablet, Take 75 mg by mouth daily., Disp: , Rfl:    cyclobenzaprine (FLEXERIL) 5 MG tablet, Take 1 tablet (5 mg total) by mouth 3 (three) times daily as needed for muscle spasms. (Patient not taking: Reported on 06/30/2021), Disp: 30 tablet, Rfl: 1   denosumab (PROLIA) 60 MG/ML SOSY injection, Inject 60 mg into the skin once., Disp: , Rfl:    estradiol (ESTRACE) 0.1 MG/GM vaginal cream, PLACE 1 GRAM VAGINALLY 2 TIMES A WEEK, Disp: , Rfl:    ezetimibe (ZETIA) 10 MG tablet, Take 1 tablet by mouth daily., Disp: , Rfl:    fluorouracil (EFUDEX) 5 % cream, Apply topically., Disp: , Rfl:    lisinopril (ZESTRIL) 5 MG tablet, Take by mouth. (Patient not taking: Reported on 02/18/2023), Disp: , Rfl:    losartan (COZAAR) 100 MG tablet, Take 100 mg by mouth daily., Disp: , Rfl:    meloxicam (MOBIC) 7.5 MG tablet, Take 1 tablet (7.5 mg total) by mouth daily. (Patient not taking: Reported on 06/30/2021), Disp: 10 tablet, Rfl: 0  Past Medical History: No past medical history on file.  Tobacco Use: Social History    Tobacco Use  Smoking Status Never  Smokeless Tobacco Never    Labs: Review Flowsheet       Latest Ref Rng & Units 09/15/2019 06/23/2021 07/13/2022  Labs for ITP Cardiac and Pulmonary Rehab  Cholestrol 100 - 199 mg/dL 366  440  347   LDL (calc) 0 - 99 mg/dL 69  85  68   HDL-C >42 mg/dL 85  95  84   Trlycerides 0 - 149 mg/dL 69  70  65     Details             Exercise Target Goals: Exercise Program Goal: Individual exercise prescription set using results from initial 6 min walk test and THRR while considering  patient's activity barriers and safety.   Exercise Prescription Goal: Initial exercise prescription builds to 30-45 minutes a day of aerobic activity, 2-3 days per week.  Home exercise guidelines will be given to patient during program as part of exercise prescription that the participant will acknowledge.   Education: Aerobic Exercise: - Group verbal and visual presentation on the components of exercise prescription. Introduces F.I.T.T principle from ACSM for exercise prescriptions.  Reviews F.I.T.T. principles of aerobic exercise including progression. Written material given at graduation.   Education: Resistance Exercise: - Group verbal and visual presentation on the components of exercise prescription. Introduces F.I.T.T principle from ACSM for exercise prescriptions  Reviews F.I.T.T. principles of resistance exercise including progression. Written material given at graduation. Flowsheet Row Cardiac Rehab from 03/03/2023 in [facility] Cardiac and Pulmonary Rehab  Date 03/03/23  Educator NT  Instruction Review Code 1- Verbalizes Understanding        Education: Exercise & Equipment Safety: - Individual verbal instruction and demonstration of equipment use and safety with use of the equipment. Flowsheet Row Cardiac Rehab from 03/03/2023 in [facility] Cardiac and Pulmonary Rehab  Date 02/24/23  Educator [facility]  Instruction Review Code 1- Verbalizes Understanding        Education: Exercise Physiology & General Exercise Guidelines: - Group verbal and written instruction with models to review the exercise physiology of the cardiovascular system and associated critical values. Provides general exercise guidelines with specific guidelines to those with heart or lung disease.  Flowsheet Row Cardiac Rehab from 03/03/2023 in [facility] Cardiac and Pulmonary Rehab  Education need identified 02/24/23       Education: Flexibility, Balance, Mind/Body Relaxation: - Group verbal and visual presentation with interactive activity on the components of exercise prescription. Introduces F.I.T.T principle from ACSM for exercise prescriptions. Reviews F.I.T.T. principles of flexibility and balance exercise training including progression. Also discusses the mind body connection.  Reviews various relaxation techniques to help reduce and manage stress (i.e. Deep breathing, progressive muscle relaxation, and visualization). Balance handout provided to take home. Written material given at graduation. Flowsheet Row Cardiac Rehab from 03/03/2023 in [facility] Cardiac and Pulmonary Rehab  Date 03/03/23  Educator NT  Instruction Review Code 1- Verbalizes Understanding       Activity Barriers & Risk Stratification:  Activity Barriers & Cardiac Risk Stratification - 02/18/23 1432       Activity Barriers & Cardiac Risk Stratification   Activity Barriers None    Cardiac Risk Stratification Moderate             6 Minute Walk:  6 Minute Walk     Row Name 02/24/23 1140         6 Minute Walk   Phase Initial     Distance 1660 feet     Walk Time 6 minutes     # of Rest Breaks 0     MPH 3.14     METS 4.14     RPE 12     Perceived Dyspnea  0     VO2 Peak 14.5     Symptoms No     Resting HR 61 bpm     Resting BP 140/82     Resting Oxygen Saturation  98 %     Exercise Oxygen Saturation  during 6 min walk 95 %     Max Ex. HR 102 bpm     Max Ex. BP 148/82     2 Minute Post BP  124/72              Oxygen Initial Assessment:   Oxygen Re-Evaluation:   Oxygen Discharge (Final Oxygen Re-Evaluation):   Initial Exercise Prescription:  Initial Exercise Prescription - 02/24/23 1100       Date of Initial Exercise RX and Referring Provider   Date 02/24/23    Referring Provider Dr. Youlanda Mighty      Oxygen   Maintain Oxygen Saturation 88% or higher      Treadmill   MPH 3.5    Grade 1    Minutes 15    METs 4.16      Elliptical   Level 1    Speed 3  Minutes 15    METs 4.14      REL-XR   Level 3    Speed 50    Minutes 15    METs 4.14      Prescription Details   Frequency (times per week) 3    Duration Progress to 30 minutes of continuous aerobic without signs/symptoms of physical distress      Intensity   THRR 40-80% of Max Heartrate 97-134    Ratings of Perceived Exertion 11-13    Perceived Dyspnea 0-4      Progression   Progression Continue to progress workloads to maintain intensity without signs/symptoms of physical distress.      Resistance Training   Training Prescription Yes    Weight 7    Reps 10-15             Perform Capillary Blood Glucose checks as needed.  Exercise Prescription Changes:   Exercise Prescription Changes     Row Name 02/24/23 1100 03/10/23 0900           Response to Exercise   Blood Pressure (Admit) 140/82 108/66      Blood Pressure (Exercise) 148/82 144/62      Blood Pressure (Exit) 124/72 106/68      Heart Rate (Admit) 61 bpm 65 bpm      Heart Rate (Exercise) 102 bpm 150 bpm      Heart Rate (Exit) 70 bpm 71 bpm      Oxygen Saturation (Admit) 98 % --      Oxygen Saturation (Exercise) 95 % --      Oxygen Saturation (Exit) 98 % --      Rating of Perceived Exertion (Exercise) 12 13      Perceived Dyspnea (Exercise) 0 --      Symptoms none none      Comments 6 MWT results First two days of exercise      Duration -- Progress to 30 minutes of  aerobic without signs/symptoms of  physical distress      Intensity -- THRR unchanged        Progression   Progression -- Continue to progress workloads to maintain intensity without signs/symptoms of physical distress.      Average METs -- 4.04        Resistance Training   Training Prescription -- Yes      Weight -- 7 lb      Reps -- 10-15        Interval Training   Interval Training -- No        Treadmill   MPH -- 3.5      Grade -- 1      Minutes -- 15      METs -- 4.16        Elliptical   Level -- 2      Speed -- 3      Minutes -- 15        REL-XR   Level -- 9      Minutes -- 15        Oxygen   Maintain Oxygen Saturation -- 88% or higher               Exercise Comments:   Exercise Comments     Row Name 03/01/23 0821           Exercise Comments First full day of exercise!  Patient was oriented to gym and equipment including functions, settings, policies, and procedures.  Patient's individual exercise  prescription and treatment plan were reviewed.  All starting workloads were established based on the results of the 6 minute walk test done at initial orientation visit.  The plan for exercise progression was also introduced and progression will be customized based on patient's performance and goals.                Exercise Goals and Review:   Exercise Goals     Row Name 02/24/23 1154             Exercise Goals   Increase Physical Activity Yes       Intervention Provide advice, education, support and counseling about physical activity/exercise needs.;Develop an individualized exercise prescription for aerobic and resistive training based on initial evaluation findings, risk stratification, comorbidities and participant's personal goals.       Expected Outcomes Short Term: Attend rehab on a regular basis to increase amount of physical activity.;Long Term: Add in home exercise to make exercise part of routine and to increase amount of physical activity.;Long Term: Exercising regularly  at least 3-5 days a week.       Increase Strength and Stamina Yes       Intervention Provide advice, education, support and counseling about physical activity/exercise needs.;Develop an individualized exercise prescription for aerobic and resistive training based on initial evaluation findings, risk stratification, comorbidities and participant's personal goals.       Expected Outcomes Short Term: Increase workloads from initial exercise prescription for resistance, speed, and METs.;Short Term: Perform resistance training exercises routinely during rehab and add in resistance training at home;Long Term: Improve cardiorespiratory fitness, muscular endurance and strength as measured by increased METs and functional capacity ( )       Able to understand and use rate of perceived exertion (RPE) scale Yes       Intervention Provide education and explanation on how to use RPE scale       Expected Outcomes Short Term: Able to use RPE daily in rehab to express subjective intensity level;Long Term:  Able to use RPE to guide intensity level when exercising independently       Able to understand and use Dyspnea scale Yes       Intervention Provide education and explanation on how to use Dyspnea scale       Expected Outcomes Short Term: Able to use Dyspnea scale daily in rehab to express subjective sense of shortness of breath during exertion;Long Term: Able to use Dyspnea scale to guide intensity level when exercising independently       Knowledge and understanding of Target Heart Rate Range (THRR) Yes       Intervention Provide education and explanation of THRR including how the numbers were predicted and where they are located for reference       Expected Outcomes Short Term: Able to state/look up THRR;Long Term: Able to use THRR to govern intensity when exercising independently;Short Term: Able to use daily as guideline for intensity in rehab       Able to check pulse independently Yes       Intervention  Provide education and demonstration on how to check pulse in carotid and radial arteries.;Review the importance of being able to check your own pulse for safety during independent exercise       Expected Outcomes Short Term: Able to explain why pulse checking is important during independent exercise;Long Term: Able to check pulse independently and accurately       Understanding of Exercise Prescription Yes  Intervention Provide education, explanation, and written materials on patient's individual exercise prescription       Expected Outcomes Short Term: Able to explain program exercise prescription;Long Term: Able to explain home exercise prescription to exercise independently                Exercise Goals Re-Evaluation :  Exercise Goals Re-Evaluation     Row Name 03/01/23 0821 03/10/23 0952           Exercise Goal Re-Evaluation   Exercise Goals Review Able to understand and use rate of perceived exertion (RPE) scale;Knowledge and understanding of Target Heart Rate Range (THRR);Able to understand and use Dyspnea scale;Understanding of Exercise Prescription Increase Physical Activity;Increase Strength and Stamina;Understanding of Exercise Prescription      Comments Reviewed RPE and dyspnea scale, THR and program prescription with pt today.  Pt voiced understanding and was given a copy of goals to take home. Italie is off to a good start in the program. She did well on the treadmill during her first two sessions at a speed of 3.5 mph and an incline of 1%. She also worked on the elliptical at level 2 and the XR at level 9. We will continue to monitor her progress in the program.      Expected Outcomes Short: Use RPE daily to regulate intensity. Long: Follow program prescription in THR. Short: Continue to follow current exercise prescription. Long: Continue exercise to improve strength and stamina.               Discharge Exercise Prescription (Final Exercise Prescription Changes):   Exercise Prescription Changes - 03/10/23 0900       Response to Exercise   Blood Pressure (Admit) 108/66    Blood Pressure (Exercise) 144/62    Blood Pressure (Exit) 106/68    Heart Rate (Admit) 65 bpm    Heart Rate (Exercise) 150 bpm    Heart Rate (Exit) 71 bpm    Rating of Perceived Exertion (Exercise) 13    Symptoms none    Comments First two days of exercise    Duration Progress to 30 minutes of  aerobic without signs/symptoms of physical distress    Intensity THRR unchanged      Progression   Progression Continue to progress workloads to maintain intensity without signs/symptoms of physical distress.    Average METs 4.04      Resistance Training   Training Prescription Yes    Weight 7 lb    Reps 10-15      Interval Training   Interval Training No      Treadmill   MPH 3.5    Grade 1    Minutes 15    METs 4.16      Elliptical   Level 2    Speed 3    Minutes 15      REL-XR   Level 9    Minutes 15      Oxygen   Maintain Oxygen Saturation 88% or higher             Nutrition:  Target Goals: Understanding of nutrition guidelines, daily intake of sodium 1500mg , cholesterol 200mg , calories 30% from fat and 7% or less from saturated fats, daily to have 5 or more servings of fruits and vegetables.  Education: All About Nutrition: -Group instruction provided by verbal, written material, interactive activities, discussions, models, and posters to present general guidelines for heart healthy nutrition including fat, fiber, MyPlate, the role of sodium in heart healthy nutrition,  utilization of the nutrition label, and utilization of this knowledge for meal planning. Follow up email sent as well. Written material given at graduation.   Biometrics:  Pre Biometrics - 02/24/23 1156       Pre Biometrics   Height 5' 4.5" (1.638 m)    Weight 124 lb (56.2 kg)    Waist Circumference 30.5 inches    Hip Circumference 37 inches    Waist to Hip Ratio 0.82 %    BMI  (Calculated) 20.96    Single Leg Stand 30 seconds              Nutrition Therapy Plan and Nutrition Goals:  Nutrition Therapy & Goals - 02/24/23 1507       Nutrition Therapy   Diet cardiac, Low na    Protein (specify units) 75    Fiber 25 grams    Whole Grain Foods 3 servings    Saturated Fats 15 max. grams    Fruits and Vegetables 5 servings/day    Sodium 2 grams      Personal Nutrition Goals   Nutrition Goal Drink 64oz of water    Personal Goal #2 Continue to build balanced plates and meet nutrition goals    Personal Goal #3 When snacking ask why.    Comments Patient drinking 45oz of water, set goal to aim for ~64oz daily. She is vegetarian and has structured eating patterns. She eats lots of fruits and veggies, includes good sources of plant based protein as well as some dairy and eggs in moderation. Reviewed Mediterranean diet handout, types of fats, sources, and how to read labels. Went over a few fact labels as well, educated on ways to keep sodium intake less than 2300mg . Built out meals and snacks focused on meeting needs with vegetarian friendly foods that she likes and will eat.      Intervention Plan   Intervention Prescribe, educate and counsel regarding individualized specific dietary modifications aiming towards targeted core components such as weight, hypertension, lipid management, diabetes, heart failure and other comorbidities.;Nutrition handout(s) given to patient.    Expected Outcomes Short Term Goal: Understand basic principles of dietary content, such as calories, fat, sodium, cholesterol and nutrients.;Short Term Goal: A plan has been developed with personal nutrition goals set during dietitian appointment.;Long Term Goal: Adherence to prescribed nutrition plan.             Nutrition Assessments:  MEDIFICTS Score Key: >=70 Need to make dietary changes  40-70 Heart Healthy Diet <= 40 Therapeutic Level Cholesterol Diet  Flowsheet Row Cardiac Rehab  from 02/18/2023 in [facility] Cardiac and Pulmonary Rehab  Picture Your Plate Total Score on Admission 85      Picture Your Plate Scores: <16 Unhealthy dietary pattern with much room for improvement. 41-50 Dietary pattern unlikely to meet recommendations for good health and room for improvement. 51-60 More healthful dietary pattern, with some room for improvement.  >60 Healthy dietary pattern, although there may be some specific behaviors that could be improved.    Nutrition Goals Re-Evaluation:   Nutrition Goals Discharge (Final Nutrition Goals Re-Evaluation):   Psychosocial: Target Goals: Acknowledge presence or absence of significant depression and/or stress, maximize coping skills, provide positive support system. Participant is able to verbalize types and ability to use techniques and skills needed for reducing stress and depression.   Education: Stress, Anxiety, and Depression - Group verbal and visual presentation to define topics covered.  Reviews how body is impacted by stress, anxiety, and depression.  Also discusses healthy ways to reduce stress and to treat/manage anxiety and depression.  Written material given at graduation.   Education: Sleep Hygiene -Provides group verbal and written instruction about how sleep can affect your health.  Define sleep hygiene, discuss sleep cycles and impact of sleep habits. Review good sleep hygiene tips.    Initial Review & Psychosocial Screening:  Initial Psych Review & Screening - 02/18/23 1437       Initial Review   Current issues with Current Stress Concerns    Source of Stress Concerns Financial      Family Dynamics   Good Support System? Yes      Screening Interventions   Interventions Encouraged to exercise;Provide feedback about the scores to participant;To provide support and resources with identified psychosocial needs    Expected Outcomes Long Term Goal: Stressors or current issues are controlled or eliminated.;Short Term  goal: Utilizing psychosocial counselor, staff and physician to assist with identification of specific Stressors or current issues interfering with healing process. Setting desired goal for each stressor or current issue identified.;Short Term goal: Identification and review with participant of any Quality of Life or Depression concerns found by scoring the questionnaire.;Long Term goal: The participant improves quality of Life and PHQ9 Scores as seen by post scores and/or verbalization of changes             Quality of Life Scores:   Quality of Life - 02/18/23 1503       Quality of Life   Select Quality of Life      Quality of Life Scores   Health/Function Pre 24.5 %    Socioeconomic Pre 23.06 %    Psych/Spiritual Pre 22.5 %    Family Pre 27.6 %    GLOBAL Pre 24.21 %            Scores of 19 and below usually indicate a poorer quality of life in these areas.  A difference of  2-3 points is a clinically meaningful difference.  A difference of 2-3 points in the total score of the Quality of Life Index has been associated with significant improvement in overall quality of life, self-image, physical symptoms, and general health in studies assessing change in quality of life.  PHQ-9: Review Flowsheet       02/24/2023  Depression screen PHQ 2/9  Decreased Interest 0  Down, Depressed, Hopeless 0  PHQ - 2 Score 0  Altered sleeping 0  Tired, decreased energy 0  Change in appetite 0  Feeling bad or failure about yourself  0  Trouble concentrating 0  Moving slowly or fidgety/restless 0  Suicidal thoughts 0  PHQ-9 Score 0  Difficult doing work/chores Not difficult at all    Details           Interpretation of Total Score  Total Score Depression Severity:  1-4 = Minimal depression, 5-9 = Mild depression, 10-14 = Moderate depression, 15-19 = Moderately severe depression, 20-27 = Severe depression   Psychosocial Evaluation and Intervention:  Psychosocial Evaluation -  02/18/23 1452       Psychosocial Evaluation & Interventions   Interventions Encouraged to exercise with the program and follow exercise prescription;Stress management education;Relaxation education    Comments Dedria is coming to cardiac rehab after a stent placement. She feels like she is recovering well. She is usually a very active person who enjoys walking and especially swimming. She was instructed not to return to those activities at her previous pace until she starts cardiac  rehab, so she is very ready to start the program. When asked about her mental health, she states that there is some stress related to whether or not she should retire and the financial involvement. She has a great support system.    Expected Outcomes Short: attend cardiac rehab for education and exercise. Long: Develop and maintain positive self care habits    Continue Psychosocial Services  Follow up required by staff             Psychosocial Re-Evaluation:   Psychosocial Discharge (Final Psychosocial Re-Evaluation):   Vocational Rehabilitation: Provide vocational rehab assistance to qualifying candidates.   Vocational Rehab Evaluation & Intervention:  Vocational Rehab - 02/18/23 1437       Initial Vocational Rehab Evaluation & Intervention   Assessment shows need for Vocational Rehabilitation No             Education: Education Goals: Education classes will be provided on a variety of topics geared toward better understanding of heart health and risk factor modification. Participant will state understanding/return demonstration of topics presented as noted by education test scores.  Learning Barriers/Preferences:  Learning Barriers/Preferences - 02/18/23 1437       Learning Barriers/Preferences   Learning Barriers None    Learning Preferences None             General Cardiac Education Topics:  AED/CPR: - Group verbal and written instruction with the use of models to demonstrate the  basic use of the AED with the basic ABC's of resuscitation.   Anatomy and Cardiac Procedures: - Group verbal and visual presentation and models provide information about basic cardiac anatomy and function. Reviews the testing methods done to diagnose heart disease and the outcomes of the test results. Describes the treatment choices: Medical Management, Angioplasty, or Coronary Bypass Surgery for treating various heart conditions including Myocardial Infarction, Angina, Valve Disease, and Cardiac Arrhythmias.  Written material given at graduation.   Medication Safety: - Group verbal and visual instruction to review commonly prescribed medications for heart and lung disease. Reviews the medication, class of the drug, and side effects. Includes the steps to properly store meds and maintain the prescription regimen.  Written material given at graduation.   Intimacy: - Group verbal instruction through game format to discuss how heart and lung disease can affect sexual intimacy. Written material given at graduation..   Know Your Numbers and Heart Failure: - Group verbal and visual instruction to discuss disease risk factors for cardiac and pulmonary disease and treatment options.  Reviews associated critical values for Overweight/Obesity, Hypertension, Cholesterol, and Diabetes.  Discusses basics of heart failure: signs/symptoms and treatments.  Introduces Heart Failure Zone chart for action plan for heart failure.  Written material given at graduation. Flowsheet Row Cardiac Rehab from 03/03/2023 in [facility] Cardiac and Pulmonary Rehab  Education need identified 02/24/23       Infection Prevention: - Provides verbal and written material to individual with discussion of infection control including proper hand washing and proper equipment cleaning during exercise session. Flowsheet Row Cardiac Rehab from 03/03/2023 in [facility] Cardiac and Pulmonary Rehab  Date 02/24/23  Educator [facility]  Instruction Review  Code 1- Verbalizes Understanding       Falls Prevention: - Provides verbal and written material to individual with discussion of falls prevention and safety. Flowsheet Row Cardiac Rehab from 03/03/2023 in [facility] Cardiac and Pulmonary Rehab  Date 02/24/23  Educator [facility]  Instruction Review Code 1- Verbalizes Understanding       Other: -  Provides group and verbal instruction on various topics (see comments)   Knowledge Questionnaire Score:  Knowledge Questionnaire Score - 02/18/23 1503       Knowledge Questionnaire Score   Pre Score 24/26             Core Components/Risk Factors/Patient Goals at Admission:  Personal Goals and Risk Factors at Admission - 02/24/23 1157       Core Components/Risk Factors/Patient Goals on Admission    Weight Management Yes    Intervention Weight Management: Develop a combined nutrition and exercise program designed to reach desired caloric intake, while maintaining appropriate intake of nutrient and fiber, sodium and fats, and appropriate energy expenditure required for the weight goal.;Weight Management: Provide education and appropriate resources to help participant work on and attain dietary goals.;Weight Management/Obesity: Establish reasonable short term and long term weight goals.    Admit Weight 124 lb (56.2 kg)    Goal Weight: Short Term 124 lb (56.2 kg)    Goal Weight: Long Term 124 lb (56.2 kg)    Expected Outcomes Short Term: Continue to assess and modify interventions until short term weight is achieved;Long Term: Adherence to nutrition and physical activity/exercise program aimed toward attainment of established weight goal;Weight Maintenance: Understanding of the daily nutrition guidelines, which includes 25-35% calories from fat, 7% or less cal from saturated fats, less than 200mg  cholesterol, less than 1.5gm of sodium, & 5 or more servings of fruits and vegetables daily;Understanding recommendations for meals to include 15-35% energy as  protein, 25-35% energy from fat, 35-60% energy from carbohydrates, less than 200mg  of dietary cholesterol, 20-35 gm of total fiber daily;Understanding of distribution of calorie intake throughout the day with the consumption of 4-5 meals/snacks    Hypertension Yes    Intervention Provide education on lifestyle modifcations including regular physical activity/exercise, weight management, moderate sodium restriction and increased consumption of fresh fruit, vegetables, and low fat dairy, alcohol moderation, and smoking cessation.;Monitor prescription use compliance.    Expected Outcomes Short Term: Continued assessment and intervention until BP is < 140/21mm HG in hypertensive participants. < 130/26mm HG in hypertensive participants with diabetes, heart failure or chronic kidney disease.;Long Term: Maintenance of blood pressure at goal levels.    Lipids Yes    Intervention Provide education and support for participant on nutrition & aerobic/resistive exercise along with prescribed medications to achieve LDL 70mg , HDL >40mg .    Expected Outcomes Short Term: Participant states understanding of desired cholesterol values and is compliant with medications prescribed. Participant is following exercise prescription and nutrition guidelines.;Long Term: Cholesterol controlled with medications as prescribed, with individualized exercise RX and with personalized nutrition plan. Value goals: LDL < 70mg , HDL > 40 mg.             Education:Diabetes - Individual verbal and written instruction to review signs/symptoms of diabetes, desired ranges of glucose level fasting, after meals and with exercise. Acknowledge that pre and post exercise glucose checks will be done for 3 sessions at entry of program.   Core Components/Risk Factors/Patient Goals Review:    Core Components/Risk Factors/Patient Goals at Discharge (Final Review):    ITP Comments:  ITP Comments     Row Name 02/18/23 1446 02/24/23 1139  03/01/23 0821 03/10/23 1517     ITP Comments Initial phone call completed. Diagnosis can be found in [facility] 9/12. EP Orientation scheduled for Wednesday 10/16 at 10am. Completed and gym orientation. Initial ITP created and sent for review to Dr. Bethann Punches, Medical Director. First  full day of exercise!  Patient was oriented to gym and equipment including functions, settings, policies, and procedures.  Patient's individual exercise prescription and treatment plan were reviewed.  All starting workloads were established based on the results of the 6 minute walk test done at initial orientation visit.  The plan for exercise progression was also introduced and progression will be customized based on patient's performance and goals. 30 Day review completed. Medical Director ITP review done, changes made as directed, and signed approval by Medical Director.     new to program             Comments:

## 2023-03-15 ENCOUNTER — Encounter: Payer: 59 | Attending: Cardiovascular Disease | Admitting: *Deleted

## 2023-03-15 DIAGNOSIS — Z955 Presence of coronary angioplasty implant and graft: Secondary | ICD-10-CM | POA: Diagnosis present

## 2023-03-15 DIAGNOSIS — Z48812 Encounter for surgical aftercare following surgery on the circulatory system: Secondary | ICD-10-CM | POA: Diagnosis not present

## 2023-03-15 NOTE — Progress Notes (Signed)
Daily Session Note  Patient Details  Name: Brandi Roach MRN: [id number] Date of Birth: 08/25/1955 Referring Provider:   Flowsheet Row Cardiac Rehab from 02/24/2023 in [facility] Cardiac and Pulmonary Rehab  Referring Provider Dr. Youlanda Mighty       Encounter Date: 03/15/2023  Check In:  Session Check In - 03/15/23 0825       Check-In   Supervising physician immediately available to respond to emergencies See telemetry face sheet for immediately available ER MD    Location [facility]-Cardiac & Pulmonary Rehab    Staff Present Cora Collum, RN, BSN, CCRP;Jason Wallace [place], RDN, LDN;Joseph [place], RCP,RRT,BSRT;[place], BS, ACSM CEP, Exercise Physiologist    Virtual Visit No    Medication changes reported     No    Fall or balance concerns reported    No    Warm-up and Cool-down Performed on first and last piece of equipment    Resistance Training Performed Yes    VAD Patient? No    PAD/SET Patient? No      Pain Assessment   Currently in Pain? No/denies                Social History   Tobacco Use  Smoking Status Never  Smokeless Tobacco Never    Goals Met:  Independence with exercise equipment Exercise tolerated well No report of concerns or symptoms today  Goals Unmet:  Not Applicable  Comments: Pt able to follow exercise prescription today without complaint.  Will continue to monitor for progression.    Dr. Bethann Punches is Medical Director for [facility] Cardiac Rehabilitation.  Dr. Vida Rigger is Medical Director for [facility] Pulmonary Rehabilitation.

## 2023-03-24 ENCOUNTER — Encounter: Payer: 59 | Admitting: *Deleted

## 2023-03-24 DIAGNOSIS — Z955 Presence of coronary angioplasty implant and graft: Secondary | ICD-10-CM

## 2023-03-24 DIAGNOSIS — Z48812 Encounter for surgical aftercare following surgery on the circulatory system: Secondary | ICD-10-CM | POA: Diagnosis not present

## 2023-03-24 NOTE — Progress Notes (Signed)
Daily Session Note  Patient Details  Name: Brandi Roach MRN: [id number] Date of Birth: December 31, 1955 Referring Provider:   Flowsheet Row Cardiac Rehab from 02/24/2023 in [facility] Cardiac and Pulmonary Rehab  Referring Provider Dr. Youlanda Mighty       Encounter Date: 03/24/2023  Check In:  Session Check In - 03/24/23 0819       Check-In   Supervising physician immediately available to respond to emergencies See telemetry face sheet for immediately available ER MD    Location [facility]-Cardiac & Pulmonary Rehab    Staff Present Ronette Deter, BS, Exercise Physiologist;Joseph Reino Kent, Guinevere Ferrari, RN, ADN;[employer], , Exercise Physiologist    Virtual Visit No    Medication changes reported     No    Fall or balance concerns reported    No    Warm-up and Cool-down Performed on first and last piece of equipment    Resistance Training Performed Yes    VAD Patient? No    PAD/SET Patient? No      Pain Assessment   Currently in Pain? No/denies                Social History   Tobacco Use  Smoking Status Never  Smokeless Tobacco Never    Goals Met:  Independence with exercise equipment Exercise tolerated well No report of concerns or symptoms today Strength training completed today  Goals Unmet:  Not Applicable  Comments: Pt able to follow exercise prescription today without complaint.  Will continue to monitor for progression.    Dr. Bethann Punches is Medical Director for [facility] Cardiac Rehabilitation.  Dr. Vida Rigger is Medical Director for [facility] Pulmonary Rehabilitation.

## 2023-03-26 ENCOUNTER — Encounter: Payer: 59 | Admitting: *Deleted

## 2023-03-26 DIAGNOSIS — Z955 Presence of coronary angioplasty implant and graft: Secondary | ICD-10-CM

## 2023-03-26 DIAGNOSIS — Z48812 Encounter for surgical aftercare following surgery on the circulatory system: Secondary | ICD-10-CM | POA: Diagnosis not present

## 2023-03-26 NOTE — Progress Notes (Signed)
Daily Session Note  Patient Details  Name: Brandi Roach MRN: [id number] Date of Birth: [birth year] Referring Provider:   Flowsheet Row Cardiac Rehab from 02/24/2023 in [facility] Cardiac and Pulmonary Rehab  Referring Provider Dr. Youlanda Mighty       Encounter Date: 03/26/2023  Check In:  Session Check In - 03/26/23 3875       Check-In   Supervising physician immediately available to respond to emergencies See telemetry face sheet for immediately available ER MD    Location [facility]-Cardiac & Pulmonary Rehab    Staff Present Cora Collum, RN, BSN, CCRP;Noah Tickle, BS, Exercise Physiologist;Joseph [place], [place]    Virtual Visit No    Medication changes reported     No    Fall or balance concerns reported    No    Warm-up and Cool-down Performed on first and last piece of equipment    Resistance Training Performed Yes    VAD Patient? No    PAD/SET Patient? No      Pain Assessment   Currently in Pain? No/denies                Social History   Tobacco Use  Smoking Status Never  Smokeless Tobacco Never    Goals Met:  Independence with exercise equipment Exercise tolerated well No report of concerns or symptoms today  Goals Unmet:  Not Applicable  Comments: Pt able to follow exercise prescription today without complaint.  Will continue to monitor for progression.    Dr. Bethann Punches is Medical Director for [facility] Cardiac Rehabilitation.  Dr. Vida Rigger is Medical Director for [facility] Pulmonary Rehabilitation.

## 2023-03-29 ENCOUNTER — Encounter: Payer: 59 | Admitting: *Deleted

## 2023-03-29 DIAGNOSIS — Z48812 Encounter for surgical aftercare following surgery on the circulatory system: Secondary | ICD-10-CM | POA: Diagnosis not present

## 2023-03-29 DIAGNOSIS — Z955 Presence of coronary angioplasty implant and graft: Secondary | ICD-10-CM

## 2023-03-29 NOTE — Progress Notes (Signed)
Daily Session Note  Patient Details  Name: Brandi Roach MRN: [id number] Date of Birth: 1955-08-07 Referring Provider:   Flowsheet Row Cardiac Rehab from 02/24/2023 in [facility] Cardiac and Pulmonary Rehab  Referring Provider Dr. Youlanda Mighty       Encounter Date: 03/29/2023  Check In:  Session Check In - 03/29/23 0824       Check-In   Supervising physician immediately available to respond to emergencies See telemetry face sheet for immediately available ER MD    Location [facility]-Cardiac & Pulmonary Rehab    Staff Present Balinda Quails, RDN, LDN;Joseph Reino Kent, [place], BS, ACSM CEP, Exercise Physiologist;Ike Maragh Katrinka Blazing, RN, ADN    Virtual Visit No    Medication changes reported     No    Fall or balance concerns reported    No    Warm-up and Cool-down Performed on first and last piece of equipment    Resistance Training Performed Yes    VAD Patient? No    PAD/SET Patient? No      Pain Assessment   Currently in Pain? No/denies                Social History   Tobacco Use  Smoking Status Never  Smokeless Tobacco Never    Goals Met:  Independence with exercise equipment Exercise tolerated well No report of concerns or symptoms today Strength training completed today  Goals Unmet:  Not Applicable  Comments: Pt able to follow exercise prescription today without complaint.  Will continue to monitor for progression.    Dr. Bethann Punches is Medical Director for [facility] Cardiac Rehabilitation.  Dr. Vida Rigger is Medical Director for [facility] Pulmonary Rehabilitation.

## 2023-03-31 ENCOUNTER — Encounter: Payer: 59 | Admitting: *Deleted

## 2023-03-31 ENCOUNTER — Encounter: Payer: Self-pay | Admitting: *Deleted

## 2023-03-31 DIAGNOSIS — Z48812 Encounter for surgical aftercare following surgery on the circulatory system: Secondary | ICD-10-CM | POA: Diagnosis not present

## 2023-03-31 DIAGNOSIS — Z955 Presence of coronary angioplasty implant and graft: Secondary | ICD-10-CM

## 2023-03-31 NOTE — Progress Notes (Signed)
Cardiac Individual Treatment Plan  Patient Details  Name: Brandi Roach MRN: [id number] Date of Birth: [birth year] Referring Provider:   Flowsheet Row Cardiac Rehab from 02/24/2023 in [facility] Cardiac and Pulmonary Rehab  Referring Provider Dr. Youlanda Mighty       Initial Encounter Date:  Flowsheet Row Cardiac Rehab from 02/24/2023 in [facility] Cardiac and Pulmonary Rehab  Date 02/24/23       Visit Diagnosis: Status post coronary artery stent placement  Patient's Home Medications on Admission:  Current Outpatient Medications:    aspirin EC 81 MG tablet, Take 1 tablet by mouth daily., Disp: , Rfl:    atorvastatin (LIPITOR) 80 MG tablet, Take 80 mg by mouth daily., Disp: , Rfl:    Cholecalciferol (VITAMIN D) 50 MCG (2000 UT) tablet, Take 2,000 Units by mouth daily., Disp: , Rfl:    Cholecalciferol 10 MCG (400 UNIT) CAPS, Take 400 Units by mouth daily., Disp: , Rfl:    clopidogrel (PLAVIX) 75 MG tablet, Take 75 mg by mouth daily., Disp: , Rfl:    cyclobenzaprine (FLEXERIL) 5 MG tablet, Take 1 tablet (5 mg total) by mouth 3 (three) times daily as needed for muscle spasms. (Patient not taking: Reported on 06/30/2021), Disp: 30 tablet, Rfl: 1   denosumab (PROLIA) 60 MG/ML SOSY injection, Inject 60 mg into the skin once., Disp: , Rfl:    estradiol (ESTRACE) 0.1 MG/GM vaginal cream, PLACE 1 GRAM VAGINALLY 2 TIMES A WEEK, Disp: , Rfl:    ezetimibe (ZETIA) 10 MG tablet, Take 1 tablet by mouth daily., Disp: , Rfl:    fluorouracil (EFUDEX) 5 % cream, Apply topically., Disp: , Rfl:    lisinopril (ZESTRIL) 5 MG tablet, Take by mouth. (Patient not taking: Reported on 02/18/2023), Disp: , Rfl:    losartan (COZAAR) 100 MG tablet, Take 100 mg by mouth daily., Disp: , Rfl:    meloxicam (MOBIC) 7.5 MG tablet, Take 1 tablet (7.5 mg total) by mouth daily. (Patient not taking: Reported on 06/30/2021), Disp: 10 tablet, Rfl: 0  Past Medical History: No past medical history on file.  Tobacco Use: Social History    Tobacco Use  Smoking Status Never  Smokeless Tobacco Never    Labs: Review Flowsheet       Latest Ref Rng & Units 09/15/2019 06/23/2021 07/13/2022  Labs for ITP Cardiac and Pulmonary Rehab  Cholestrol 100 - 199 mg/dL 528  413  244   LDL (calc) 0 - 99 mg/dL 69  85  68   HDL-C >01 mg/dL 85  95  84   Trlycerides 0 - 149 mg/dL 69  70  65     Details             Exercise Target Goals: Exercise Program Goal: Individual exercise prescription set using results from initial 6 min walk test and THRR while considering  patient's activity barriers and safety.   Exercise Prescription Goal: Initial exercise prescription builds to 30-45 minutes a day of aerobic activity, 2-3 days per week.  Home exercise guidelines will be given to patient during program as part of exercise prescription that the participant will acknowledge.   Education: Aerobic Exercise: - Group verbal and visual presentation on the components of exercise prescription. Introduces F.I.T.T principle from ACSM for exercise prescriptions.  Reviews F.I.T.T. principles of aerobic exercise including progression. Written material given at graduation.   Education: Resistance Exercise: - Group verbal and visual presentation on the components of exercise prescription. Introduces F.I.T.T principle from ACSM for exercise prescriptions  Reviews F.I.T.T. principles of resistance exercise including progression. Written material given at graduation. Flowsheet Row Cardiac Rehab from 03/31/2023 in [facility] Cardiac and Pulmonary Rehab  Date 03/03/23  Educator NT  Instruction Review Code 1- Verbalizes Understanding        Education: Exercise & Equipment Safety: - Individual verbal instruction and demonstration of equipment use and safety with use of the equipment. Flowsheet Row Cardiac Rehab from 03/31/2023 in [facility] Cardiac and Pulmonary Rehab  Date 02/24/23  Educator [facility]  Instruction Review Code 1- Verbalizes Understanding        Education: Exercise Physiology & General Exercise Guidelines: - Group verbal and written instruction with models to review the exercise physiology of the cardiovascular system and associated critical values. Provides general exercise guidelines with specific guidelines to those with heart or lung disease.  Flowsheet Row Cardiac Rehab from 03/31/2023 in [facility] Cardiac and Pulmonary Rehab  Education need identified 02/24/23       Education: Flexibility, Balance, Mind/Body Relaxation: - Group verbal and visual presentation with interactive activity on the components of exercise prescription. Introduces F.I.T.T principle from ACSM for exercise prescriptions. Reviews F.I.T.T. principles of flexibility and balance exercise training including progression. Also discusses the mind body connection.  Reviews various relaxation techniques to help reduce and manage stress (i.e. Deep breathing, progressive muscle relaxation, and visualization). Balance handout provided to take home. Written material given at graduation. Flowsheet Row Cardiac Rehab from 03/31/2023 in [facility] Cardiac and Pulmonary Rehab  Date 03/03/23  Educator NT  Instruction Review Code 1- Verbalizes Understanding       Activity Barriers & Risk Stratification:  Activity Barriers & Cardiac Risk Stratification - 02/18/23 1432       Activity Barriers & Cardiac Risk Stratification   Activity Barriers None    Cardiac Risk Stratification Moderate             6 Minute Walk:  6 Minute Walk     Row Name 02/24/23 1140         6 Minute Walk   Phase Initial     Distance 1660 feet     Walk Time 6 minutes     # of Rest Breaks 0     MPH 3.14     METS 4.14     RPE 12     Perceived Dyspnea  0     VO2 Peak 14.5     Symptoms No     Resting HR 61 bpm     Resting BP 140/82     Resting Oxygen Saturation  98 %     Exercise Oxygen Saturation  during 6 min walk 95 %     Max Ex. HR 102 bpm     Max Ex. BP 148/82     2 Minute Post BP  124/72              Oxygen Initial Assessment:   Oxygen Re-Evaluation:   Oxygen Discharge (Final Oxygen Re-Evaluation):   Initial Exercise Prescription:  Initial Exercise Prescription - 02/24/23 1100       Date of Initial Exercise RX and Referring Provider   Date 02/24/23    Referring Provider Dr. Youlanda Mighty      Oxygen   Maintain Oxygen Saturation 88% or higher      Treadmill   MPH 3.5    Grade 1    Minutes 15    METs 4.16      Elliptical   Level 1    Speed 3  Minutes 15    METs 4.14      REL-XR   Level 3    Speed 50    Minutes 15    METs 4.14      Prescription Details   Frequency (times per week) 3    Duration Progress to 30 minutes of continuous aerobic without signs/symptoms of physical distress      Intensity   THRR 40-80% of Max Heartrate 97-134    Ratings of Perceived Exertion 11-13    Perceived Dyspnea 0-4      Progression   Progression Continue to progress workloads to maintain intensity without signs/symptoms of physical distress.      Resistance Training   Training Prescription Yes    Weight 7    Reps 10-15             Perform Capillary Blood Glucose checks as needed.  Exercise Prescription Changes:   Exercise Prescription Changes     Row Name 02/24/23 1100 03/10/23 0900 03/25/23 1400 03/29/23 0800       Response to Exercise   Blood Pressure (Admit) 140/82 108/66 110/66 --    Blood Pressure (Exercise) 148/82 144/62 144/60 --    Blood Pressure (Exit) 124/72 106/68 102/60 --    Heart Rate (Admit) 61 bpm 65 bpm 70 bpm --    Heart Rate (Exercise) 102 bpm 150 bpm 140 bpm --    Heart Rate (Exit) 70 bpm 71 bpm 95 bpm --    Oxygen Saturation (Admit) 98 % -- -- --    Oxygen Saturation (Exercise) 95 % -- -- --    Oxygen Saturation (Exit) 98 % -- -- --    Rating of Perceived Exertion (Exercise) 12 13 13  --    Perceived Dyspnea (Exercise) 0 -- 0 --    Symptoms none none none --    Comments 6 MWT results First two days of  exercise -- --    Duration -- Progress to 30 minutes of  aerobic without signs/symptoms of physical distress Progress to 30 minutes of  aerobic without signs/symptoms of physical distress Progress to 30 minutes of  aerobic without signs/symptoms of physical distress    Intensity -- THRR unchanged THRR unchanged THRR New  100-145      Progression   Progression -- Continue to progress workloads to maintain intensity without signs/symptoms of physical distress. Continue to progress workloads to maintain intensity without signs/symptoms of physical distress. Continue to progress workloads to maintain intensity without signs/symptoms of physical distress.    Average METs -- 4.04 5.22 5.22      Resistance Training   Training Prescription -- Yes Yes Yes    Weight -- 7 lb 7 lb 7 lb    Reps -- 10-15 10-15 10-15      Interval Training   Interval Training -- No No No      Treadmill   MPH -- 3.5 3.7 3.7    Grade -- 1 1 1     Minutes -- 15 15 15     METs -- 4.16 4.34 4.34      Elliptical   Level -- 2 6 6     Speed -- 3 3 3     Minutes -- 15 15 15     METs -- -- 6.1 6.1      REL-XR   Level -- 9 -- --    Minutes -- 15 -- --      Oxygen   Maintain Oxygen Saturation -- 88% or higher 88% or  higher 88% or higher             Exercise Comments:   Exercise Comments     Row Name 03/01/23 1610 03/29/23 0900         Exercise Comments First full day of exercise!  Patient was oriented to gym and equipment including functions, settings, policies, and procedures.  Patient's individual exercise prescription and treatment plan were reviewed.  All starting workloads were established based on the results of the 6 minute walk test done at initial orientation visit.  The plan for exercise progression was also introduced and progression will be customized based on patient's performance and goals. THRR was recalculate for Imagin due to her current work loads and response to exercise. New THRR is 100-145 bpm.                Exercise Goals and Review:   Exercise Goals     Row Name 02/24/23 1154             Exercise Goals   Increase Physical Activity Yes       Intervention Provide advice, education, support and counseling about physical activity/exercise needs.;Develop an individualized exercise prescription for aerobic and resistive training based on initial evaluation findings, risk stratification, comorbidities and participant's personal goals.       Expected Outcomes Short Term: Attend rehab on a regular basis to increase amount of physical activity.;Long Term: Add in home exercise to make exercise part of routine and to increase amount of physical activity.;Long Term: Exercising regularly at least 3-5 days a week.       Increase Strength and Stamina Yes       Intervention Provide advice, education, support and counseling about physical activity/exercise needs.;Develop an individualized exercise prescription for aerobic and resistive training based on initial evaluation findings, risk stratification, comorbidities and participant's personal goals.       Expected Outcomes Short Term: Increase workloads from initial exercise prescription for resistance, speed, and METs.;Short Term: Perform resistance training exercises routinely during rehab and add in resistance training at home;Long Term: Improve cardiorespiratory fitness, muscular endurance and strength as measured by increased METs and functional capacity ( )       Able to understand and use rate of perceived exertion (RPE) scale Yes       Intervention Provide education and explanation on how to use RPE scale       Expected Outcomes Short Term: Able to use RPE daily in rehab to express subjective intensity level;Long Term:  Able to use RPE to guide intensity level when exercising independently       Able to understand and use Dyspnea scale Yes       Intervention Provide education and explanation on how to use Dyspnea scale       Expected  Outcomes Short Term: Able to use Dyspnea scale daily in rehab to express subjective sense of shortness of breath during exertion;Long Term: Able to use Dyspnea scale to guide intensity level when exercising independently       Knowledge and understanding of Target Heart Rate Range (THRR) Yes       Intervention Provide education and explanation of THRR including how the numbers were predicted and where they are located for reference       Expected Outcomes Short Term: Able to state/look up THRR;Long Term: Able to use THRR to govern intensity when exercising independently;Short Term: Able to use daily as guideline for intensity in rehab  Able to check pulse independently Yes       Intervention Provide education and demonstration on how to check pulse in carotid and radial arteries.;Review the importance of being able to check your own pulse for safety during independent exercise       Expected Outcomes Short Term: Able to explain why pulse checking is important during independent exercise;Long Term: Able to check pulse independently and accurately       Understanding of Exercise Prescription Yes       Intervention Provide education, explanation, and written materials on patient's individual exercise prescription       Expected Outcomes Short Term: Able to explain program exercise prescription;Long Term: Able to explain home exercise prescription to exercise independently                Exercise Goals Re-Evaluation :  Exercise Goals Re-Evaluation     Row Name 03/01/23 0821 03/10/23 0952 03/25/23 1459 03/29/23 0900       Exercise Goal Re-Evaluation   Exercise Goals Review Able to understand and use rate of perceived exertion (RPE) scale;Knowledge and understanding of Target Heart Rate Range (THRR);Able to understand and use Dyspnea scale;Understanding of Exercise Prescription Increase Physical Activity;Increase Strength and Stamina;Understanding of Exercise Prescription Increase Physical  Activity;Increase Strength and Stamina;Understanding of Exercise Prescription --    Comments Reviewed RPE and dyspnea scale, THR and program prescription with pt today.  Pt voiced understanding and was given a copy of goals to take home. Brandi Roach is off to a good start in the program. She did well on the treadmill during her first two sessions at a speed of 3.5 mph and an incline of 1%. She also worked on the elliptical at level 2 and the XR at level 9. We will continue to monitor her progress in the program. Brandi Roach continues to make improvements in the program. She has been able to increase her level on the elliptical from level 2 to 6. She has also increased her speed on the treadmill from 3. to 3.59mph. We will continue to monitor her progress in the program. THRR was recalculate for Brandi Roach due to her current work loads and response to exercise. New THRR is 100-145 bpm.    Expected Outcomes Short: Use RPE daily to regulate intensity. Long: Follow program prescription in THR. Short: Continue to follow current exercise prescription. Long: Continue exercise to improve strength and stamina. Short: Continue to follow current exercise prescription. Long: Continue exercise to improve strength and stamina. --             Discharge Exercise Prescription (Final Exercise Prescription Changes):  Exercise Prescription Changes - 03/29/23 0800       Response to Exercise   Duration Progress to 30 minutes of  aerobic without signs/symptoms of physical distress    Intensity THRR New   100-145     Progression   Progression Continue to progress workloads to maintain intensity without signs/symptoms of physical distress.    Average METs 5.22      Resistance Training   Training Prescription Yes    Weight 7 lb    Reps 10-15      Interval Training   Interval Training No      Treadmill   MPH 3.7    Grade 1    Minutes 15    METs 4.34      Elliptical   Level 6    Speed 3    Minutes 15    METs 6.1  Oxygen   Maintain Oxygen Saturation 88% or higher             Nutrition:  Target Goals: Understanding of nutrition guidelines, daily intake of sodium 1500mg , cholesterol 200mg , calories 30% from fat and 7% or less from saturated fats, daily to have 5 or more servings of fruits and vegetables.  Education: All About Nutrition: -Group instruction provided by verbal, written material, interactive activities, discussions, models, and posters to present general guidelines for heart healthy nutrition including fat, fiber, MyPlate, the role of sodium in heart healthy nutrition, utilization of the nutrition label, and utilization of this knowledge for meal planning. Follow up email sent as well. Written material given at graduation. Flowsheet Row Cardiac Rehab from 03/31/2023 in [facility] Cardiac and Pulmonary Rehab  Date 03/24/23  Educator JG  Instruction Review Code 1- Verbalizes Understanding       Biometrics:  Pre Biometrics - 02/24/23 1156       Pre Biometrics   Height 5' 4.5" (1.638 m)    Weight 124 lb (56.2 kg)    Waist Circumference 30.5 inches    Hip Circumference 37 inches    Waist to Hip Ratio 0.82 %    BMI (Calculated) 20.96    Single Leg Stand 30 seconds              Nutrition Therapy Plan and Nutrition Goals:  Nutrition Therapy & Goals - 02/24/23 1507       Nutrition Therapy   Diet cardiac, Low na    Protein (specify units) 75    Fiber 25 grams    Whole Grain Foods 3 servings    Saturated Fats 15 max. grams    Fruits and Vegetables 5 servings/day    Sodium 2 grams      Personal Nutrition Goals   Nutrition Goal Drink 64oz of water    Personal Goal #2 Continue to build balanced plates and meet nutrition goals    Personal Goal #3 When snacking ask why.    Comments Patient drinking 45oz of water, set goal to aim for ~64oz daily. She is vegetarian and has structured eating patterns. She eats lots of fruits and veggies, includes good sources of plant based  protein as well as some dairy and eggs in moderation. Reviewed Mediterranean diet handout, types of fats, sources, and how to read labels. Went over a few fact labels as well, educated on ways to keep sodium intake less than 2300mg . Built out meals and snacks focused on meeting needs with vegetarian friendly foods that she likes and will eat.      Intervention Plan   Intervention Prescribe, educate and counsel regarding individualized specific dietary modifications aiming towards targeted core components such as weight, hypertension, lipid management, diabetes, heart failure and other comorbidities.;Nutrition handout(s) given to patient.    Expected Outcomes Short Term Goal: Understand basic principles of dietary content, such as calories, fat, sodium, cholesterol and nutrients.;Short Term Goal: A plan has been developed with personal nutrition goals set during dietitian appointment.;Long Term Goal: Adherence to prescribed nutrition plan.             Nutrition Assessments:  MEDIFICTS Score Key: >=70 Need to make dietary changes  40-70 Heart Healthy Diet <= 40 Therapeutic Level Cholesterol Diet  Flowsheet Row Cardiac Rehab from 02/18/2023 in [facility] Cardiac and Pulmonary Rehab  Picture Your Plate Total Score on Admission 85      Picture Your Plate Scores: <84 Unhealthy dietary pattern with much room for improvement.  41-50 Dietary pattern unlikely to meet recommendations for good health and room for improvement. 51-60 More healthful dietary pattern, with some room for improvement.  >60 Healthy dietary pattern, although there may be some specific behaviors that could be improved.    Nutrition Goals Re-Evaluation:  Nutrition Goals Re-Evaluation     Row Name 03/31/23 0747             Goals   Comment Brandi Roach is using a system that helps her track how much water she is drinking. Her bottle has a measure on it to keep track of how much water she is eating. She has no other questions with  her diet at this time.       Expected Outcome Short: continue to drink more water. Long: maintain a diet that adheres to her.                Nutrition Goals Discharge (Final Nutrition Goals Re-Evaluation):  Nutrition Goals Re-Evaluation - 03/31/23 0747       Goals   Comment Brandi Roach is using a system that helps her track how much water she is drinking. Her bottle has a measure on it to keep track of how much water she is eating. She has no other questions with her diet at this time.    Expected Outcome Short: continue to drink more water. Long: maintain a diet that adheres to her.             Psychosocial: Target Goals: Acknowledge presence or absence of significant depression and/or stress, maximize coping skills, provide positive support system. Participant is able to verbalize types and ability to use techniques and skills needed for reducing stress and depression.   Education: Stress, Anxiety, and Depression - Group verbal and visual presentation to define topics covered.  Reviews how body is impacted by stress, anxiety, and depression.  Also discusses healthy ways to reduce stress and to treat/manage anxiety and depression.  Written material given at graduation.   Education: Sleep Hygiene -Provides group verbal and written instruction about how sleep can affect your health.  Define sleep hygiene, discuss sleep cycles and impact of sleep habits. Review good sleep hygiene tips.    Initial Review & Psychosocial Screening:  Initial Psych Review & Screening - 02/18/23 1437       Initial Review   Current issues with Current Stress Concerns    Source of Stress Concerns Financial      Family Dynamics   Good Support System? Yes      Screening Interventions   Interventions Encouraged to exercise;Provide feedback about the scores to participant;To provide support and resources with identified psychosocial needs    Expected Outcomes Long Term Goal: Stressors or current issues are  controlled or eliminated.;Short Term goal: Utilizing psychosocial counselor, staff and physician to assist with identification of specific Stressors or current issues interfering with healing process. Setting desired goal for each stressor or current issue identified.;Short Term goal: Identification and review with participant of any Quality of Life or Depression concerns found by scoring the questionnaire.;Long Term goal: The participant improves quality of Life and PHQ9 Scores as seen by post scores and/or verbalization of changes             Quality of Life Scores:   Quality of Life - 02/18/23 1503       Quality of Life   Select Quality of Life      Quality of Life Scores   Health/Function Pre 24.5 %  Socioeconomic Pre 23.06 %    Psych/Spiritual Pre 22.5 %    Family Pre 27.6 %    GLOBAL Pre 24.21 %            Scores of 19 and below usually indicate a poorer quality of life in these areas.  A difference of  2-3 points is a clinically meaningful difference.  A difference of 2-3 points in the total score of the Quality of Life Index has been associated with significant improvement in overall quality of life, self-image, physical symptoms, and general health in studies assessing change in quality of life.  PHQ-9: Review Flowsheet       02/24/2023  Depression screen PHQ 2/9  Decreased Interest 0  Down, Depressed, Hopeless 0  PHQ - 2 Score 0  Altered sleeping 0  Tired, decreased energy 0  Change in appetite 0  Feeling bad or failure about yourself  0  Trouble concentrating 0  Moving slowly or fidgety/restless 0  Suicidal thoughts 0  PHQ-9 Score 0  Difficult doing work/chores Not difficult at all    Details           Interpretation of Total Score  Total Score Depression Severity:  1-4 = Minimal depression, 5-9 = Mild depression, 10-14 = Moderate depression, 15-19 = Moderately severe depression, 20-27 = Severe depression   Psychosocial Evaluation and  Intervention:  Psychosocial Evaluation - 02/18/23 1452       Psychosocial Evaluation & Interventions   Interventions Encouraged to exercise with the program and follow exercise prescription;Stress management education;Relaxation education    Comments Brandi 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 She feels like she is recovering well. She is usually a very active person who enjoys walking and especially swimming. She was instructed not to return to those activities at her previous pace until she starts cardiac rehab, so she is very ready to start the program. When asked about her mental health, she states that there is some stress related to whether or not she should retire and the financial involvement. She has a great support system.    Expected Outcomes Short: attend cardiac rehab for education and exercise. Long: Develop and maintain positive self care habits    Continue Psychosocial Services  Follow up required by staff             Psychosocial Re-Evaluation:  Psychosocial Re-Evaluation     Row Name 03/31/23 [phone]             Psychosocial Re-Evaluation   Current issues with None Identified       Comments Patient reports no issues with their current mental states, sleep, stress, depression or anxiety. Will follow up with patient in a few weeks for any changes.       Expected Outcomes Short: Continue to exercise regularly to support mental health and notify staff of any changes. Long: maintain mental health and well being through teaching of rehab or prescribed medications independently.       Interventions Encouraged to attend Cardiac Rehabilitation for the exercise       Continue Psychosocial Services  Follow up required by staff                Psychosocial Discharge (Final Psychosocial Re-Evaluation):  Psychosocial Re-Evaluation - 03/31/23 0746       Psychosocial Re-Evaluation   Current issues with None Identified    Comments Patient reports no issues with  their current mental states, sleep, stress, depression or anxiety.  Will follow up with patient in a few weeks for any changes.    Expected Outcomes Short: Continue to exercise regularly to support mental health and notify staff of any changes. Long: maintain mental health and well being through teaching of rehab or prescribed medications independently.    Interventions Encouraged to attend Cardiac Rehabilitation for the exercise    Continue Psychosocial Services  Follow up required by staff             Vocational Rehabilitation: Provide vocational rehab assistance to qualifying candidates.   Vocational Rehab Evaluation & Intervention:  Vocational Rehab - 02/18/23 1437       Initial Vocational Rehab Evaluation & Intervention   Assessment shows need for Vocational Rehabilitation No             Education: Education Goals: Education classes will be provided on a variety of topics geared toward better understanding of heart health and risk factor modification. Participant will state understanding/return demonstration of topics presented as noted by education test scores.  Learning Barriers/Preferences:  Learning Barriers/Preferences - 02/18/23 1437       Learning Barriers/Preferences   Learning Barriers None    Learning Preferences None             General Cardiac Education Topics:  AED/CPR: - Group verbal and written instruction with the use of models to demonstrate the basic use of the AED with the basic ABC's of resuscitation.   Anatomy and Cardiac Procedures: - Group verbal and visual presentation and models provide information about basic cardiac anatomy and function. Reviews the testing methods done to diagnose heart disease and the outcomes of the test results. Describes the treatment choices: Medical Management, Angioplasty, or Coronary Bypass Surgery for treating various heart conditions including Myocardial Infarction, Angina, Valve Disease, and Cardiac  Arrhythmias.  Written material given at graduation. Flowsheet Row Cardiac Rehab from 03/31/2023 in [facility] Cardiac and Pulmonary Rehab  Date 03/31/23  Educator SB  Instruction Review Code 1- Verbalizes Understanding       Medication Safety: - Group verbal and visual instruction to review commonly prescribed medications for heart and lung disease. Reviews the medication, class of the drug, and side effects. Includes the steps to properly store meds and maintain the prescription regimen.  Written material given at graduation.   Intimacy: - Group verbal instruction through game format to discuss how heart and lung disease can affect sexual intimacy. Written material given at graduation..   Know Your Numbers and Heart Failure: - Group verbal and visual instruction to discuss disease risk factors for cardiac and pulmonary disease and treatment options.  Reviews associated critical values for Overweight/Obesity, Hypertension, Cholesterol, and Diabetes.  Discusses basics of heart failure: signs/symptoms and treatments.  Introduces Heart Failure Zone chart for action plan for heart failure.  Written material given at graduation. Flowsheet Row Cardiac Rehab from 03/31/2023 in [facility] Cardiac and Pulmonary Rehab  Education need identified 02/24/23       Infection Prevention: - Provides verbal and written material to individual with discussion of infection control including proper hand washing and proper equipment cleaning during exercise session. Flowsheet Row Cardiac Rehab from 03/31/2023 in [facility] Cardiac and Pulmonary Rehab  Date 02/24/23  Educator [facility]  Instruction Review Code 1- Verbalizes Understanding       Falls Prevention: - Provides verbal and written material to individual with discussion of falls prevention and safety. Flowsheet Row Cardiac Rehab from 03/31/2023 in [facility] Cardiac and Pulmonary Rehab  Date 02/24/23  Educator [facility]  Instruction Review Code 1- Verbalizes Understanding        Other: -Provides group and verbal instruction on various topics (see comments)   Knowledge Questionnaire Score:  Knowledge Questionnaire Score - 02/18/23 1503       Knowledge Questionnaire Score   Pre Score 24/26             Core Components/Risk Factors/Patient Goals at Admission:  Personal Goals and Risk Factors at Admission - 02/24/23 1157       Core Components/Risk Factors/Patient Goals on Admission    Weight Management Yes    Intervention Weight Management: Develop a combined nutrition and exercise program designed to reach desired caloric intake, while maintaining appropriate intake of nutrient and fiber, sodium and fats, and appropriate energy expenditure required for the weight goal.;Weight Management: Provide education and appropriate resources to help participant work on and attain dietary goals.;Weight Management/Obesity: Establish reasonable short term and long term weight goals.    Admit Weight 124 lb (56.2 kg)    Goal Weight: Short Term 124 lb (56.2 kg)    Goal Weight: Long Term 124 lb (56.2 kg)    Expected Outcomes Short Term: Continue to assess and modify interventions until short term weight is achieved;Long Term: Adherence to nutrition and physical activity/exercise program aimed toward attainment of established weight goal;Weight Maintenance: Understanding of the daily nutrition guidelines, which includes 25-35% calories from fat, 7% or less cal from saturated fats, less than 200mg  cholesterol, less than 1.5gm of sodium, & 5 or more servings of fruits and vegetables daily;Understanding recommendations for meals to include 15-35% energy as protein, 25-35% energy from fat, 35-60% energy from carbohydrates, less than 200mg  of dietary cholesterol, 20-35 gm of total fiber daily;Understanding of distribution of calorie intake throughout the day with the consumption of 4-5 meals/snacks    Hypertension Yes    Intervention Provide education on lifestyle modifcations  including regular physical activity/exercise, weight management, moderate sodium restriction and increased consumption of fresh fruit, vegetables, and low fat dairy, alcohol moderation, and smoking cessation.;Monitor prescription use compliance.    Expected Outcomes Short Term: Continued assessment and intervention until BP is < 140/35mm HG in hypertensive participants. < 130/28mm HG in hypertensive participants with diabetes, heart failure or chronic kidney disease.;Long Term: Maintenance of blood pressure at goal levels.    Lipids Yes    Intervention Provide education and support for participant on nutrition & aerobic/resistive exercise along with prescribed medications to achieve LDL 70mg , HDL >40mg .    Expected Outcomes Short Term: Participant states understanding of desired cholesterol values and is compliant with medications prescribed. Participant is following exercise prescription and nutrition guidelines.;Long Term: Cholesterol controlled with medications as prescribed, with individualized exercise RX and with personalized nutrition plan. Value goals: LDL < 70mg , HDL > 40 mg.             Education:Diabetes - Individual verbal and written instruction to review signs/symptoms of diabetes, desired ranges of glucose level fasting, after meals and with exercise. Acknowledge that pre and post exercise glucose checks will be done for 3 sessions at entry of program.   Core Components/Risk Factors/Patient Goals Review:   Goals and Risk Factor Review     Row Name 03/31/23 0749             Core Components/Risk Factors/Patient Goals Review   Personal Goals Review Weight Management/Obesity;Other       Review Brandi Roach is doing well in the program and is maintaining her weight. Her blood pressure is doing well  and is usually never hypertensive. She has no questions on her medications and will continue to exercise.       Expected Outcomes Short: continue exercise. Long: maintain exercise post  [facility].                Core Components/Risk Factors/Patient Goals at Discharge (Final Review):   Goals and Risk Factor Review - 03/31/23 0749       Core Components/Risk Factors/Patient Goals Review   Personal Goals Review Weight Management/Obesity;Other    Review Brandi Roach is doing well in the program and is maintaining her weight. Her blood pressure is doing well and is usually never hypertensive. She has no questions on her medications and will continue to exercise.    Expected Outcomes Short: continue exercise. Long: maintain exercise post [facility].             ITP Comments:  ITP Comments     Row Name 02/18/23 1446 02/24/23 1139 03/01/23 0821 03/10/23 1517 03/31/23 1118   ITP Comments Initial phone call completed. Diagnosis can be found in [facility] 9/12. EP Orientation scheduled for Wednesday 10/16 at 10am. Completed and gym orientation. Initial ITP created and sent for review to Dr. Bethann Punches, Medical Director. First full day of exercise!  Patient was oriented to gym and equipment including functions, settings, policies, and procedures.  Patient's individual exercise prescription and treatment plan were reviewed.  All starting workloads were established based on the results of the 6 minute walk test done at initial orientation visit.  The plan for exercise progression was also introduced and progression will be customized based on patient's performance and goals. 30 Day review completed. Medical Director ITP review done, changes made as directed, and signed approval by Medical Director.     new to program 30 Day review completed. Medical Director ITP review done, changes made as directed, and signed approval by Medical Director.            Comments:

## 2023-03-31 NOTE — Progress Notes (Signed)
Daily Session Note  Patient Details  Name: Brandi Roach MRN: [id number] Date of Birth: 12/11/1955 Referring Provider:   Flowsheet Row Cardiac Rehab from 02/24/2023 in [facility] Cardiac and Pulmonary Rehab  Referring Provider Dr. Youlanda Mighty       Encounter Date: 03/31/2023  Check In:  Session Check In - 03/31/23 0850       Check-In   Supervising physician immediately available to respond to emergencies See telemetry face sheet for immediately available ER MD    Location [facility]-Cardiac & Pulmonary Rehab    Staff Present Elige Ko, RCP,RRT,BSRT;Margaret Best, MS, Exercise Physiologist;Maxon Conetta BS, , Exercise Physiologist;Zahari Fazzino Katrinka Blazing, RN, ADN    Virtual Visit No    Medication changes reported     No    Fall or balance concerns reported    No    Warm-up and Cool-down Performed on first and last piece of equipment    Resistance Training Performed Yes    VAD Patient? No    PAD/SET Patient? No      Pain Assessment   Currently in Pain? No/denies                Social History   Tobacco Use  Smoking Status Never  Smokeless Tobacco Never    Goals Met:  Independence with exercise equipment Exercise tolerated well No report of concerns or symptoms today Strength training completed today  Goals Unmet:  Not Applicable  Comments: Pt able to follow exercise prescription today without complaint.  Will continue to monitor for progression.    Dr. Bethann Punches is Medical Director for [facility] Cardiac Rehabilitation.  Dr. Vida Rigger is Medical Director for [facility] Pulmonary Rehabilitation.

## 2023-04-02 ENCOUNTER — Encounter: Payer: 59 | Admitting: *Deleted

## 2023-04-02 DIAGNOSIS — Z48812 Encounter for surgical aftercare following surgery on the circulatory system: Secondary | ICD-10-CM | POA: Diagnosis not present

## 2023-04-02 DIAGNOSIS — Z955 Presence of coronary angioplasty implant and graft: Secondary | ICD-10-CM

## 2023-04-02 NOTE — Progress Notes (Signed)
Daily Session Note  Patient Details  Name: Brandi Roach MRN: [id number] Date of Birth: 05-08-1956 Referring Provider:   Flowsheet Row Cardiac Rehab from 02/24/2023 in [facility] Cardiac and Pulmonary Rehab  Referring Provider Dr. Youlanda Mighty       Encounter Date: 04/02/2023  Check In:  Session Check In - 04/02/23 0808       Check-In   Supervising physician immediately available to respond to emergencies See telemetry face sheet for immediately available ER MD    Location [facility]-Cardiac & Pulmonary Rehab    Staff Present Cora Collum, RN, BSN, CCRP;Joseph Hood, RCP,RRT,BSRT;Noah Tickle, [place], Exercise Physiologist    Virtual Visit No    Medication changes reported     No    Fall or balance concerns reported    No    Warm-up and Cool-down Performed on first and last piece of equipment    Resistance Training Performed Yes    VAD Patient? No    PAD/SET Patient? No      Pain Assessment   Currently in Pain? No/denies                Social History   Tobacco Use  Smoking Status Never  Smokeless Tobacco Never    Goals Met:  Independence with exercise equipment Exercise tolerated well No report of concerns or symptoms today  Goals Unmet:  Not Applicable  Comments: Pt able to follow exercise prescription today without complaint.  Will continue to monitor for progression.    Dr. Bethann Punches is Medical Director for [facility] Cardiac Rehabilitation.  Dr. Vida Rigger is Medical Director for [facility] Pulmonary Rehabilitation.

## 2023-04-05 ENCOUNTER — Encounter: Payer: 59 | Admitting: *Deleted

## 2023-04-05 DIAGNOSIS — Z955 Presence of coronary angioplasty implant and graft: Secondary | ICD-10-CM

## 2023-04-05 DIAGNOSIS — Z48812 Encounter for surgical aftercare following surgery on the circulatory system: Secondary | ICD-10-CM | POA: Diagnosis not present

## 2023-04-05 NOTE — Progress Notes (Signed)
Daily Session Note  Patient Details  Name: Brandi Roach MRN: [id number] Date of Birth: 02-28-1956 Referring Provider:   Flowsheet Row Cardiac Rehab from 02/24/2023 in [facility] Cardiac and Pulmonary Rehab  Referring Provider Dr. Youlanda Mighty       Encounter Date: 04/05/2023  Check In:  Session Check In - 04/05/23 0803       Check-In   Supervising physician immediately available to respond to emergencies See telemetry face sheet for immediately available ER MD    Location [facility]-Cardiac & Pulmonary Rehab    Staff Present Balinda Quails, RDN, LDN;Joseph Reino Kent, [place] Huxley, BS, ACSM CEP, Exercise Physiologist;Geniva Lohnes Katrinka Blazing, RN, ADN    Virtual Visit No    Medication changes reported     No    Fall or balance concerns reported    No    Warm-up and Cool-down Performed on first and last piece of equipment    Resistance Training Performed Yes    VAD Patient? No    PAD/SET Patient? No      Pain Assessment   Currently in Pain? No/denies                Social History   Tobacco Use  Smoking Status Never  Smokeless Tobacco Never    Goals Met:  Independence with exercise equipment Exercise tolerated well No report of concerns or symptoms today Strength training completed today  Goals Unmet:  Not Applicable  Comments: Pt able to follow exercise prescription today without complaint.  Will continue to monitor for progression.    Dr. Bethann Punches is Medical Director for [facility] Cardiac Rehabilitation.  Dr. Vida Rigger is Medical Director for [facility] Pulmonary Rehabilitation.

## 2023-04-16 ENCOUNTER — Encounter: Payer: 59 | Attending: Cardiovascular Disease | Admitting: *Deleted

## 2023-04-16 DIAGNOSIS — Z48812 Encounter for surgical aftercare following surgery on the circulatory system: Secondary | ICD-10-CM | POA: Insufficient documentation

## 2023-04-16 DIAGNOSIS — Z955 Presence of coronary angioplasty implant and graft: Secondary | ICD-10-CM | POA: Diagnosis present

## 2023-04-16 NOTE — Progress Notes (Signed)
Daily Session Note  Patient Details  Name: Brandi Roach MRN: [id number] Date of Birth: Mar 27, 1956 Referring Provider:   Flowsheet Row Cardiac Rehab from 02/24/2023 in [facility] Cardiac and Pulmonary Rehab  Referring Provider Dr. Youlanda Mighty       Encounter Date: 04/16/2023  Check In:  Session Check In - 04/16/23 0935       Check-In   Supervising physician immediately available to respond to emergencies See telemetry face sheet for immediately available ER MD    Location [facility]-Cardiac & Pulmonary Rehab    Staff Present Cora Collum, RN, BSN, CCRP;Noah Tickle, BS, Exercise Physiologist;Joseph Bayard, [place]    Virtual Visit No    Medication changes reported     No    Fall or balance concerns reported    No    Warm-up and Cool-down Performed on first and last piece of equipment    Resistance Training Performed Yes    VAD Patient? No    PAD/SET Patient? No      Pain Assessment   Currently in Pain? No/denies                Social History   Tobacco Use  Smoking Status Never  Smokeless Tobacco Never    Goals Met:  Independence with exercise equipment Exercise tolerated well No report of concerns or symptoms today  Goals Unmet:  Not Applicable  Comments: Pt able to follow exercise prescription today without complaint.  Will continue to monitor for progression.    Dr. Bethann Punches is Medical Director for [facility] Cardiac Rehabilitation.  Dr. Vida Rigger is Medical Director for [facility] Pulmonary Rehabilitation.

## 2023-04-21 ENCOUNTER — Encounter: Payer: 59 | Admitting: *Deleted

## 2023-04-21 DIAGNOSIS — Z955 Presence of coronary angioplasty implant and graft: Secondary | ICD-10-CM

## 2023-04-21 DIAGNOSIS — Z48812 Encounter for surgical aftercare following surgery on the circulatory system: Secondary | ICD-10-CM | POA: Diagnosis not present

## 2023-04-21 NOTE — Progress Notes (Signed)
Daily Session Note  Patient Details  Name: Brandi Roach MRN: [id number] Date of Birth: 1956/05/07 Referring Provider:   Flowsheet Row Cardiac Rehab from 02/24/2023 in [facility] Cardiac and Pulmonary Rehab  Referring Provider Dr. Youlanda Mighty       Encounter Date: 04/21/2023  Check In:  Session Check In - 04/21/23 9629       Check-In   Supervising physician immediately available to respond to emergencies See telemetry face sheet for immediately available ER MD    Location [facility]-Cardiac & Pulmonary Rehab    Staff Present Cora Collum, RN, BSN, CCRP;Noah Tickle, BS, Exercise Physiologist;Maxon Conetta BS, , Exercise Physiologist;Kashis Penley Katrinka Blazing, RN, ADN    Virtual Visit No    Medication changes reported     No    Fall or balance concerns reported    No    Warm-up and Cool-down Performed on first and last piece of equipment    Resistance Training Performed Yes    VAD Patient? No    PAD/SET Patient? No      Pain Assessment   Currently in Pain? No/denies                Social History   Tobacco Use  Smoking Status Never  Smokeless Tobacco Never    Goals Met:  Independence with exercise equipment Exercise tolerated well No report of concerns or symptoms today Strength training completed today  Goals Unmet:  Not Applicable  Comments: Pt able to follow exercise prescription today without complaint.  Will continue to monitor for progression.    Dr. Bethann Punches is Medical Director for [facility] Cardiac Rehabilitation.  Dr. Vida Rigger is Medical Director for [facility] Pulmonary Rehabilitation.

## 2023-04-23 ENCOUNTER — Encounter: Payer: 59 | Admitting: *Deleted

## 2023-04-23 DIAGNOSIS — Z955 Presence of coronary angioplasty implant and graft: Secondary | ICD-10-CM

## 2023-04-23 DIAGNOSIS — Z48812 Encounter for surgical aftercare following surgery on the circulatory system: Secondary | ICD-10-CM | POA: Diagnosis not present

## 2023-04-23 NOTE — Progress Notes (Signed)
Daily Session Note  Patient Details  Name: Brandi Roach MRN: [id number] Date of Birth: 1955/05/30 Referring Provider:   Flowsheet Row Cardiac Rehab from 02/24/2023 in [facility] Cardiac and Pulmonary Rehab  Referring Provider Dr. Youlanda Mighty       Encounter Date: 04/23/2023  Check In:  Session Check In - 04/23/23 0850       Check-In   Supervising physician immediately available to respond to emergencies See telemetry face sheet for immediately available ER MD    Location [facility]-Cardiac & Pulmonary Rehab    Staff Present Ronette Deter, BS, Exercise Physiologist;Joseph Shelbie Proctor, RN, [place]    Virtual Visit No    Medication changes reported     No    Fall or balance concerns reported    No    Warm-up and Cool-down Performed on first and last piece of equipment    Resistance Training Performed Yes    VAD Patient? No    PAD/SET Patient? No      Pain Assessment   Currently in Pain? No/denies                Social History   Tobacco Use  Smoking Status Never  Smokeless Tobacco Never    Goals Met:  Independence with exercise equipment Exercise tolerated well No report of concerns or symptoms today Strength training completed today  Goals Unmet:  Not Applicable  Comments: Pt able to follow exercise prescription today without complaint.  Will continue to monitor for progression.    Dr. Bethann Punches is Medical Director for [facility] Cardiac Rehabilitation.  Dr. Vida Rigger is Medical Director for [facility] Pulmonary Rehabilitation.

## 2023-04-25 IMAGING — CR DG LUMBAR SPINE COMPLETE 4+V
1 series · 5 of 5 positions shown · non-contrast
Comparison: None.

CLINICAL DATA: Left-sided back pain

EXAM:
LUMBAR SPINE - COMPLETE 4+ VIEW

[Series 1: dg lumbar spine complete 4 +v · 0.14mm/px · 5 of 5 slices shown]
[im 1/5]
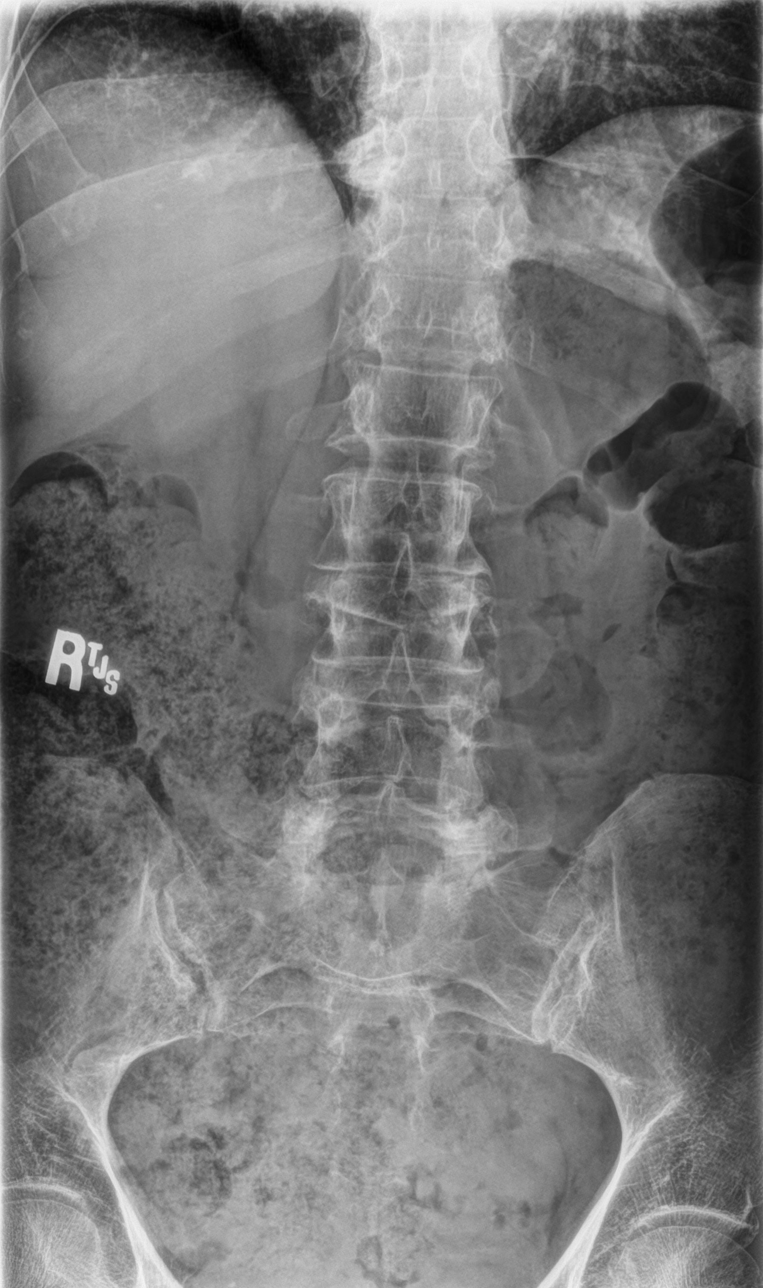
[im 2/5]
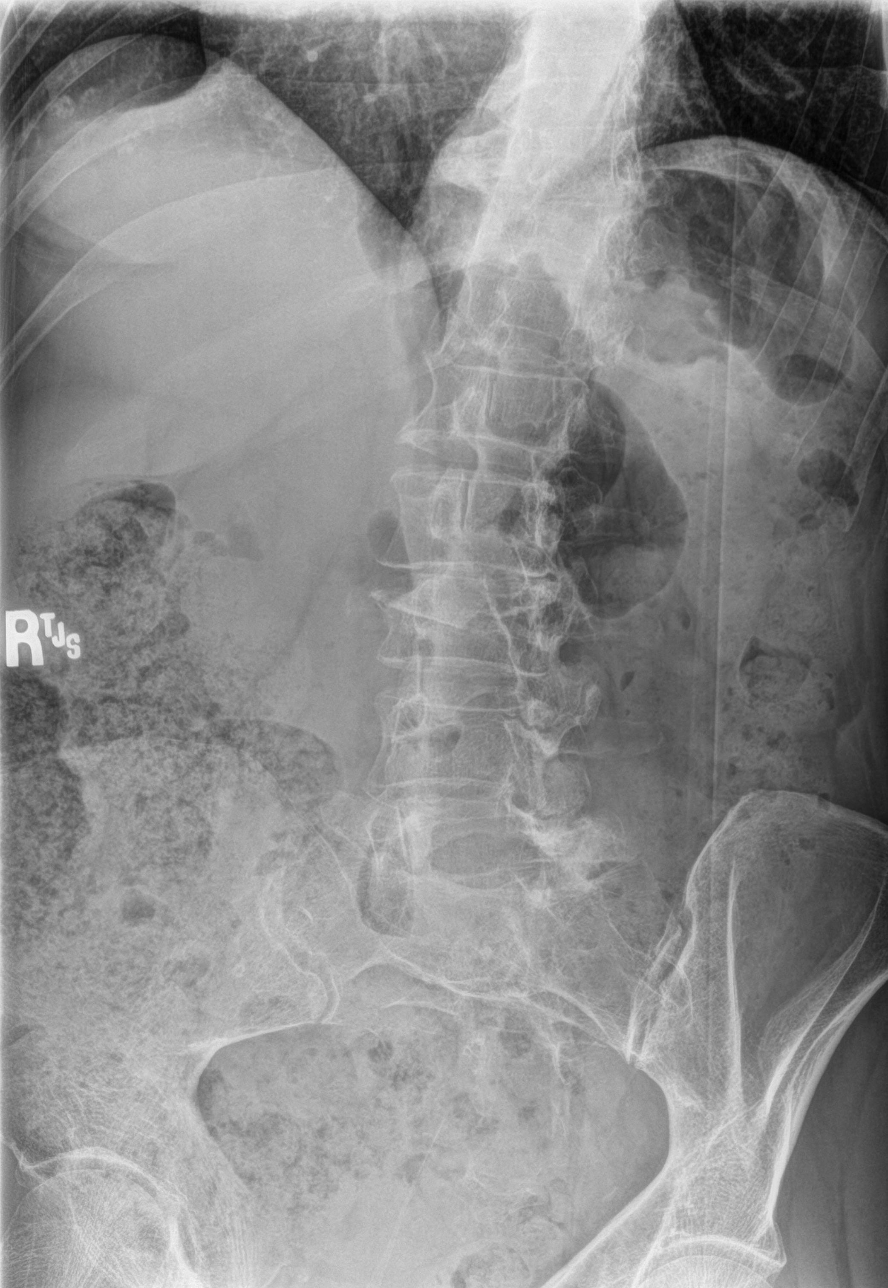
[im 3/5]
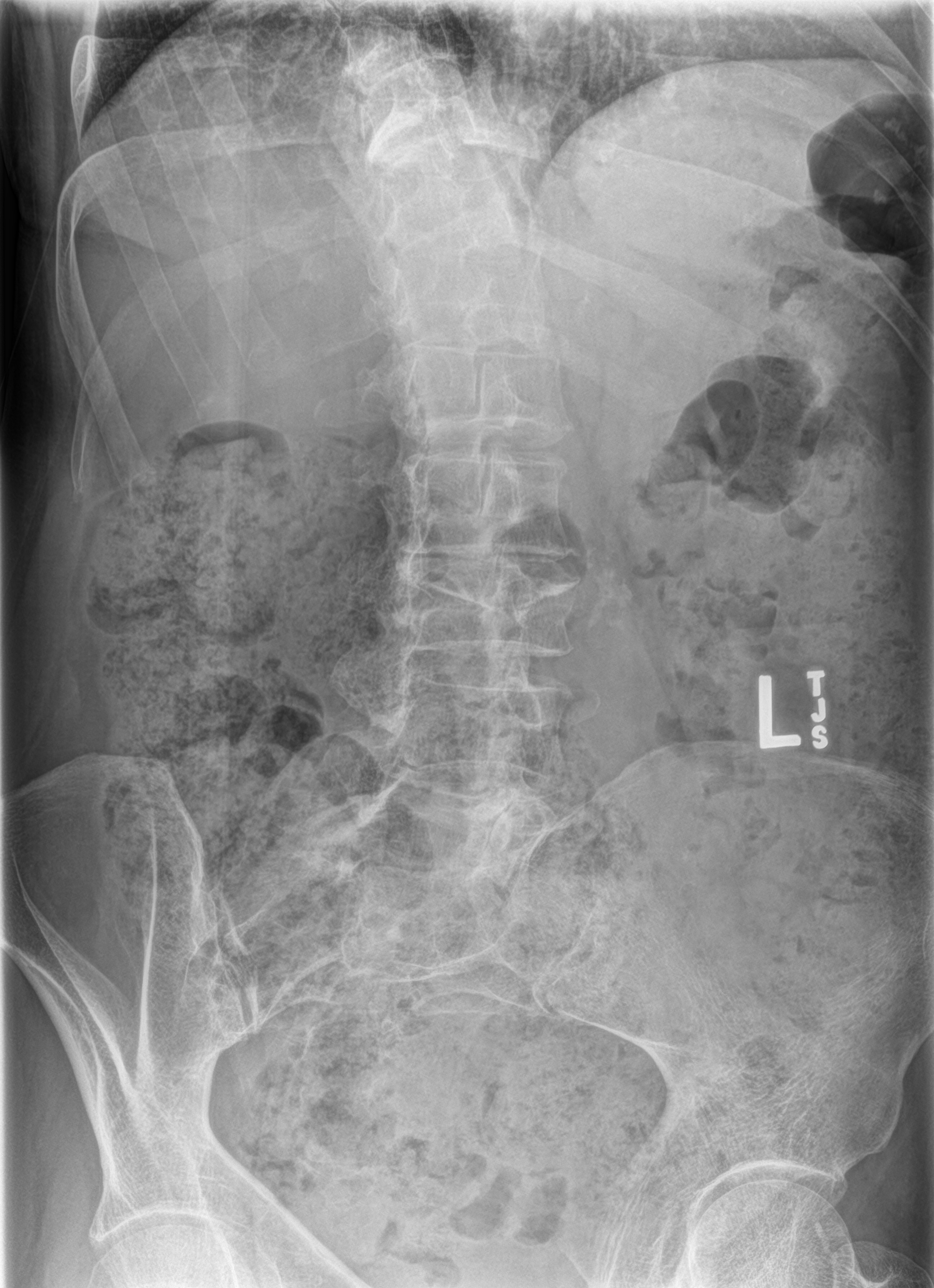
[im 4/5]
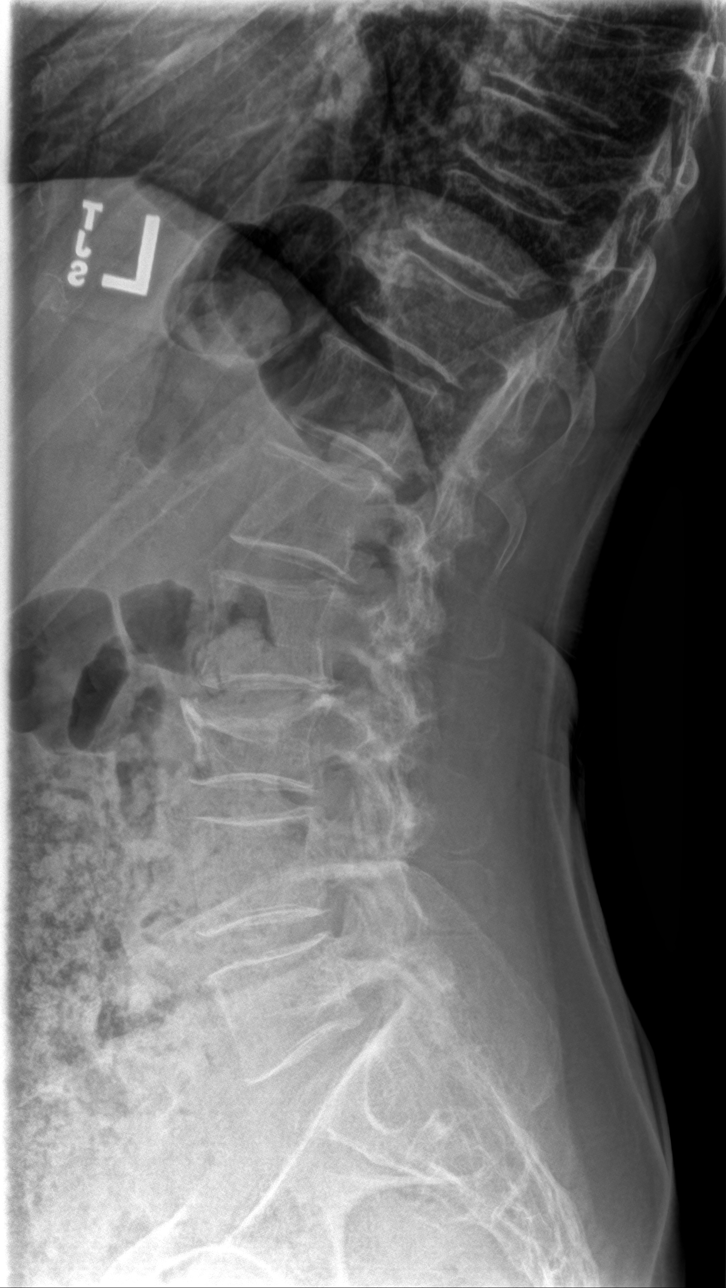
[im 5/5]
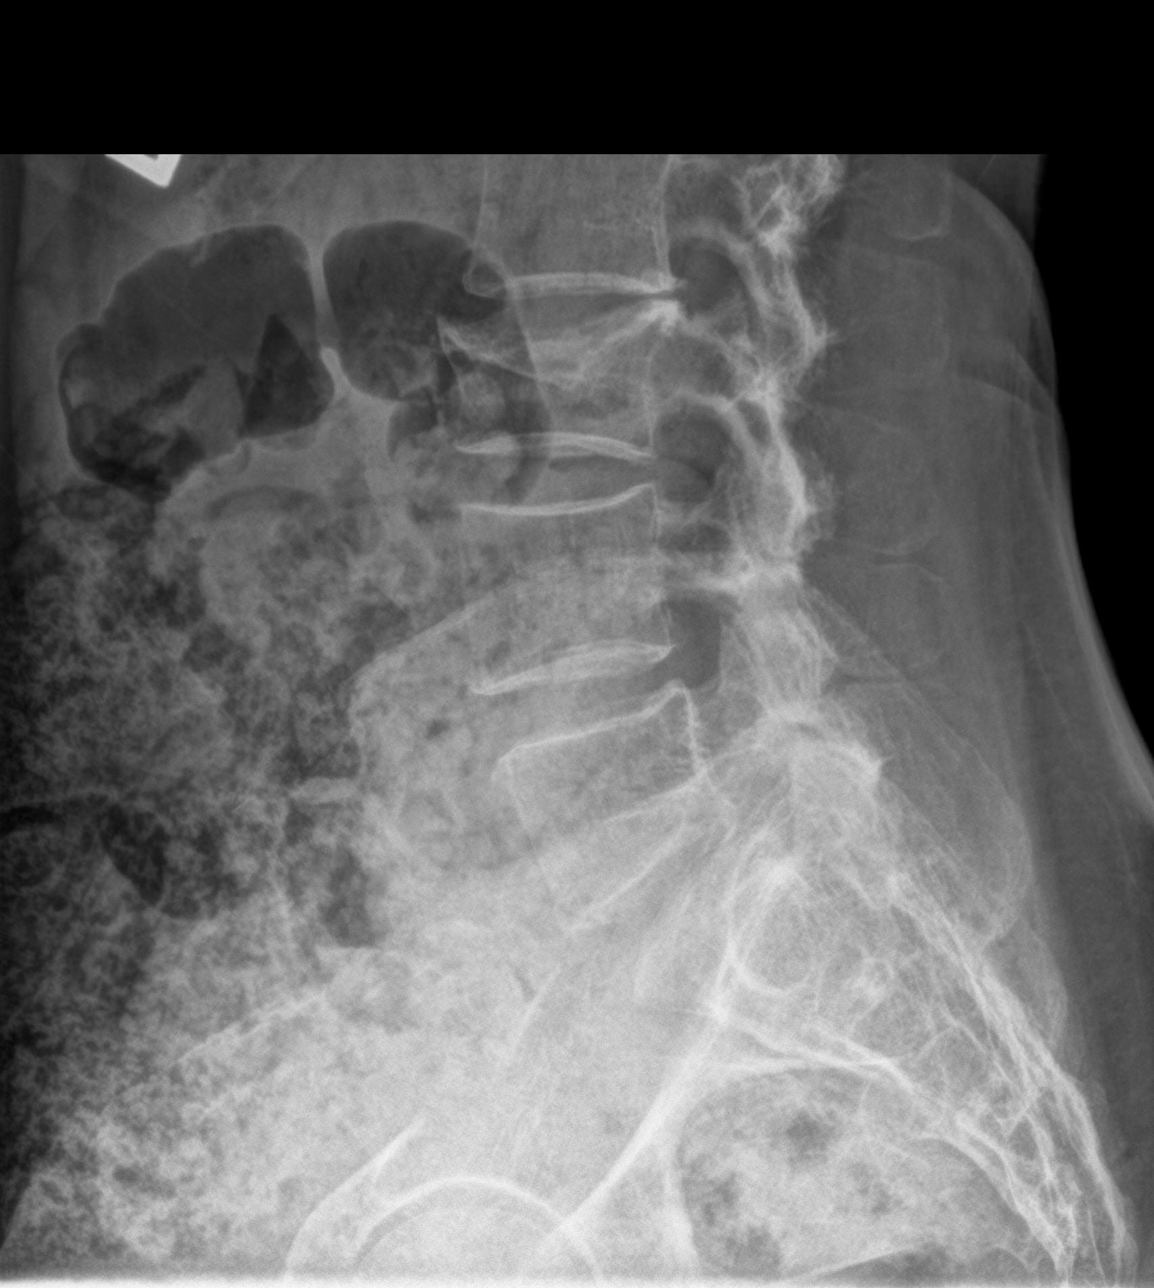

[5 of 5 positions shown; findings below may reference images not displayed]

FINDINGS: Lumbar alignment within normal limits. Moderate superior endplate
compression fracture at L3. Remaining vertebra demonstrate normal
stature. Facet degenerative changes of the lower lumbar spine. Mild
degenerative spurring at L2-L3. Large stool burden
IMPRESSION: 1. Moderate compression fracture L3
2. Degenerative changes at L2-L3. Mild facet degenerative changes of
the lower lumbar spine

## 2023-04-26 ENCOUNTER — Encounter: Payer: 59 | Admitting: *Deleted

## 2023-04-26 DIAGNOSIS — Z48812 Encounter for surgical aftercare following surgery on the circulatory system: Secondary | ICD-10-CM | POA: Diagnosis not present

## 2023-04-26 DIAGNOSIS — Z955 Presence of coronary angioplasty implant and graft: Secondary | ICD-10-CM

## 2023-04-26 NOTE — Progress Notes (Signed)
Daily Session Note  Patient Details  Name: Brandi Roach MRN: [id number] Date of Birth: 1955/10/12 Referring Provider:   Flowsheet Row Cardiac Rehab from 02/24/2023 in [facility] Cardiac and Pulmonary Rehab  Referring Provider Dr. Youlanda Mighty       Encounter Date: 04/26/2023  Check In:  Session Check In - 04/26/23 0803       Check-In   Supervising physician immediately available to respond to emergencies See telemetry face sheet for immediately available ER MD    Location [facility]-Cardiac & Pulmonary Rehab    Staff Present Balinda Quails, RDN, LDN;Joseph Reino Kent, [place] Hux[place], BS, ACSM CEP, Exercise Physiologist;Mahati Vajda Katrinka Blazing, RN, ADN    Virtual Visit No    Medication changes reported     No    Fall or balance concerns reported    No    Warm-up and Cool-down Performed on first and last piece of equipment    Resistance Training Performed Yes    VAD Patient? No    PAD/SET Patient? No      Pain Assessment   Currently in Pain? No/denies                Social History   Tobacco Use  Smoking Status Never  Smokeless Tobacco Never    Goals Met:  Independence with exercise equipment Exercise tolerated well No report of concerns or symptoms today Strength training completed today  Goals Unmet:  Not Applicable  Comments: Pt able to follow exercise prescription today without complaint.  Will continue to monitor for progression.    Dr. Bethann Punches is Medical Director for [facility] Cardiac Rehabilitation.  Dr. Vida Rigger is Medical Director for [facility] Pulmonary Rehabilitation.

## 2023-04-28 ENCOUNTER — Encounter: Payer: 59 | Admitting: *Deleted

## 2023-04-28 ENCOUNTER — Encounter: Payer: Self-pay | Admitting: *Deleted

## 2023-04-28 DIAGNOSIS — Z48812 Encounter for surgical aftercare following surgery on the circulatory system: Secondary | ICD-10-CM | POA: Diagnosis not present

## 2023-04-28 DIAGNOSIS — Z955 Presence of coronary angioplasty implant and graft: Secondary | ICD-10-CM

## 2023-04-28 NOTE — Progress Notes (Signed)
Daily Session Note  Patient Details  Name: Brandi Roach MRN: [id number] Date of Birth: Nov 20, 1955 Referring Provider:   Flowsheet Row Cardiac Rehab from 02/24/2023 in [facility] Cardiac and Pulmonary Rehab  Referring Provider Dr. Youlanda Mighty       Encounter Date: 04/28/2023  Check In:  Session Check In - 04/28/23 0812       Check-In   Supervising physician immediately available to respond to emergencies See telemetry face sheet for immediately available ER MD    Location [facility]-Cardiac & Pulmonary Rehab    Staff Present Elige Ko, RCP,RRT,BSRT;Krista Karleen Hampshire RN, BSN;Maxon Conetta BS, , Exercise Physiologist;Goro Wenrick Katrinka Blazing, RN, ADN    Virtual Visit No    Medication changes reported     No    Fall or balance concerns reported    No    Warm-up and Cool-down Performed on first and last piece of equipment    Resistance Training Performed Yes    VAD Patient? No    PAD/SET Patient? No      Pain Assessment   Currently in Pain? No/denies                Social History   Tobacco Use  Smoking Status Never  Smokeless Tobacco Never    Goals Met:  Independence with exercise equipment Exercise tolerated well No report of concerns or symptoms today Strength training completed today  Goals Unmet:  Not Applicable  Comments: Pt able to follow exercise prescription today without complaint.  Will continue to monitor for progression.    Dr. Bethann Punches is Medical Director for [facility] Cardiac Rehabilitation.  Dr. Vida Rigger is Medical Director for [facility] Pulmonary Rehabilitation.

## 2023-04-28 NOTE — Progress Notes (Signed)
Cardiac Individual Treatment Plan  Patient Details  Name: Brandi Roach MRN: [id number] Date of Birth: May 15, 1955 Referring Provider:   Flowsheet Row Cardiac Rehab from 02/24/2023 in [facility] Cardiac and Pulmonary Rehab  Referring Provider Dr. Youlanda Mighty       Initial Encounter Date:  Flowsheet Row Cardiac Rehab from 02/24/2023 in [facility] Cardiac and Pulmonary Rehab  Date 02/24/23       Visit Diagnosis: Status post coronary artery stent placement  Patient's Home Medications on Admission:  Current Outpatient Medications:    aspirin EC 81 MG tablet, Take 1 tablet by mouth daily., Disp: , Rfl:    atorvastatin (LIPITOR) 80 MG tablet, Take 80 mg by mouth daily., Disp: , Rfl:    Cholecalciferol (VITAMIN D) 50 MCG (2000 UT) tablet, Take 2,000 Units by mouth daily., Disp: , Rfl:    Cholecalciferol 10 MCG (400 UNIT) CAPS, Take 400 Units by mouth daily., Disp: , Rfl:    clopidogrel (PLAVIX) 75 MG tablet, Take 75 mg by mouth daily., Disp: , Rfl:    cyclobenzaprine (FLEXERIL) 5 MG tablet, Take 1 tablet (5 mg total) by mouth 3 (three) times daily as needed for muscle spasms. (Patient not taking: Reported on 06/30/2021), Disp: 30 tablet, Rfl: 1   denosumab (PROLIA) 60 MG/ML SOSY injection, Inject 60 mg into the skin once., Disp: , Rfl:    estradiol (ESTRACE) 0.1 MG/GM vaginal cream, PLACE 1 GRAM VAGINALLY 2 TIMES A WEEK, Disp: , Rfl:    ezetimibe (ZETIA) 10 MG tablet, Take 1 tablet by mouth daily., Disp: , Rfl:    fluorouracil (EFUDEX) 5 % cream, Apply topically., Disp: , Rfl:    lisinopril (ZESTRIL) 5 MG tablet, Take by mouth. (Patient not taking: Reported on 02/18/2023), Disp: , Rfl:    losartan (COZAAR) 100 MG tablet, Take 100 mg by mouth daily., Disp: , Rfl:    meloxicam (MOBIC) 7.5 MG tablet, Take 1 tablet (7.5 mg total) by mouth daily. (Patient not taking: Reported on 06/30/2021), Disp: 10 tablet, Rfl: 0  Past Medical History: No past medical history on file.  Tobacco Use: Social History    Tobacco Use  Smoking Status Never  Smokeless Tobacco Never    Labs: Review Flowsheet       Latest Ref Rng & Units 09/15/2019 06/23/2021 07/13/2022  Labs for ITP Cardiac and Pulmonary Rehab  Cholestrol 100 - 199 mg/dL 409  811  914   LDL (calc) 0 - 99 mg/dL 69  85  68   HDL-C >78 mg/dL 85  95  84   Trlycerides 0 - 149 mg/dL 69  70  65      Exercise Target Goals: Exercise Program Goal: Individual exercise prescription set using results from initial 6 min walk test and THRR while considering  patient's activity barriers and safety.   Exercise Prescription Goal: Initial exercise prescription builds to 30-45 minutes a day of aerobic activity, 2-3 days per week.  Home exercise guidelines will be given to patient during program as part of exercise prescription that the participant will acknowledge.   Education: Aerobic Exercise: - Group verbal and visual presentation on the components of exercise prescription. Introduces F.I.T.T principle from ACSM for exercise prescriptions.  Reviews F.I.T.T. principles of aerobic exercise including progression. Written material given at graduation.   Education: Resistance Exercise: - Group verbal and visual presentation on the components of exercise prescription. Introduces F.I.T.T principle from ACSM for exercise prescriptions  Reviews F.I.T.T. principles of resistance exercise including progression. Written material given at  graduation. Flowsheet Row Cardiac Rehab from 03/31/2023 in [facility] Cardiac and Pulmonary Rehab  Date 03/03/23  Educator NT  Instruction Review Code 1- Verbalizes Understanding        Education: Exercise & Equipment Safety: - Individual verbal instruction and demonstration of equipment use and safety with use of the equipment. Flowsheet Row Cardiac Rehab from 03/31/2023 in [facility] Cardiac and Pulmonary Rehab  Date 02/24/23  Educator [facility]  Instruction Review Code 1- Verbalizes Understanding       Education: Exercise Physiology  & General Exercise Guidelines: - Group verbal and written instruction with models to review the exercise physiology of the cardiovascular system and associated critical values. Provides general exercise guidelines with specific guidelines to those with heart or lung disease.  Flowsheet Row Cardiac Rehab from 03/31/2023 in [facility] Cardiac and Pulmonary Rehab  Education need identified 02/24/23       Education: Flexibility, Balance, Mind/Body Relaxation: - Group verbal and visual presentation with interactive activity on the components of exercise prescription. Introduces F.I.T.T principle from ACSM for exercise prescriptions. Reviews F.I.T.T. principles of flexibility and balance exercise training including progression. Also discusses the mind body connection.  Reviews various relaxation techniques to help reduce and manage stress (i.e. Deep breathing, progressive muscle relaxation, and visualization). Balance handout provided to take home. Written material given at graduation. Flowsheet Row Cardiac Rehab from 03/31/2023 in [facility] Cardiac and Pulmonary Rehab  Date 03/03/23  Educator NT  Instruction Review Code 1- Verbalizes Understanding       Activity Barriers & Risk Stratification:  Activity Barriers & Cardiac Risk Stratification - 02/18/23 1432       Activity Barriers & Cardiac Risk Stratification   Activity Barriers None    Cardiac Risk Stratification Moderate             6 Minute Walk:  6 Minute Walk     Row Name 02/24/23 1140         6 Minute Walk   Phase Initial     Distance 1660 feet     Walk Time 6 minutes     # of Rest Breaks 0     MPH 3.14     METS 4.14     RPE 12     Perceived Dyspnea  0     VO2 Peak 14.5     Symptoms No     Resting HR 61 bpm     Resting BP 140/82     Resting Oxygen Saturation  98 %     Exercise Oxygen Saturation  during 6 min walk 95 %     Max Ex. HR 102 bpm     Max Ex. BP 148/82     2 Minute Post BP 124/72              Oxygen  Initial Assessment:   Oxygen Re-Evaluation:   Oxygen Discharge (Final Oxygen Re-Evaluation):   Initial Exercise Prescription:  Initial Exercise Prescription - 02/24/23 1100       Date of Initial Exercise RX and Referring Provider   Date 02/24/23    Referring Provider Dr. Youlanda Mighty      Oxygen   Maintain Oxygen Saturation 88% or higher      Treadmill   MPH 3.5    Grade 1    Minutes 15    METs 4.16      Elliptical   Level 1    Speed 3    Minutes 15    METs 4.14  REL-XR   Level 3    Speed 50    Minutes 15    METs 4.14      Prescription Details   Frequency (times per week) 3    Duration Progress to 30 minutes of continuous aerobic without signs/symptoms of physical distress      Intensity   THRR 40-80% of Max Heartrate 97-134    Ratings of Perceived Exertion 11-13    Perceived Dyspnea 0-4      Progression   Progression Continue to progress workloads to maintain intensity without signs/symptoms of physical distress.      Resistance Training   Training Prescription Yes    Weight 7    Reps 10-15             Perform Capillary Blood Glucose checks as needed.  Exercise Prescription Changes:   Exercise Prescription Changes     Row Name 02/24/23 1100 03/10/23 0900 03/25/23 1400 03/29/23 0800 04/05/23 0800     Response to Exercise   Blood Pressure (Admit) 140/82 108/66 110/66 -- 132/68   Blood Pressure (Exercise) 148/82 144/62 144/60 -- 172/90   Blood Pressure (Exit) 124/72 106/68 102/60 -- 114/60   Heart Rate (Admit) 61 bpm 65 bpm 70 bpm -- 85 bpm   Heart Rate (Exercise) 102 bpm 150 bpm 140 bpm -- 164 bpm   Heart Rate (Exit) 70 bpm 71 bpm 95 bpm -- 116 bpm   Oxygen Saturation (Admit) 98 % -- -- -- --   Oxygen Saturation (Exercise) 95 % -- -- -- --   Oxygen Saturation (Exit) 98 % -- -- -- --   Rating of Perceived Exertion (Exercise) 12 13 13  -- 14   Perceived Dyspnea (Exercise) 0 -- 0 -- 0   Symptoms none none none -- none   Comments 6  MWT results First two days of exercise -- -- --   Duration -- Progress to 30 minutes of  aerobic without signs/symptoms of physical distress Progress to 30 minutes of  aerobic without signs/symptoms of physical distress Progress to 30 minutes of  aerobic without signs/symptoms of physical distress Progress to 30 minutes of  aerobic without signs/symptoms of physical distress   Intensity -- THRR unchanged THRR unchanged THRR New  100-145 THRR unchanged     Progression   Progression -- Continue to progress workloads to maintain intensity without signs/symptoms of physical distress. Continue to progress workloads to maintain intensity without signs/symptoms of physical distress. Continue to progress workloads to maintain intensity without signs/symptoms of physical distress. Continue to progress workloads to maintain intensity without signs/symptoms of physical distress.   Average METs -- 4.04 5.22 5.22 5.22     Resistance Training   Training Prescription -- Yes Yes Yes Yes   Weight -- 7 lb 7 lb 7 lb 7 lb   Reps -- 10-15 10-15 10-15 10-15     Interval Training   Interval Training -- No No No No     Treadmill   MPH -- 3.5 3.7 3.7 4.7   Grade -- 1 1 1  3.5   Minutes -- 15 15 15 15    METs -- 4.16 4.34 4.34 6.87     Elliptical   Level -- 2 6 6 7    Speed -- 3 3 3 4    Minutes -- 15 15 15 15    METs -- -- 6.1 6.1 7.6     REL-XR   Level -- 9 -- -- 10   Minutes -- 15 -- -- 15  METs -- -- -- -- 5.1     Oxygen   Maintain Oxygen Saturation -- 88% or higher 88% or higher 88% or higher 88% or higher    Row Name 04/22/23 0800             Response to Exercise   Blood Pressure (Admit) 110/64       Blood Pressure (Exercise) 120/82       Blood Pressure (Exit) 98/58       Heart Rate (Admit) 85 bpm       Heart Rate (Exercise) 150 bpm       Heart Rate (Exit) 99 bpm       Rating of Perceived Exertion (Exercise) 13       Symptoms none       Duration Continue with 30 min of aerobic exercise  without signs/symptoms of physical distress.       Intensity THRR unchanged         Progression   Progression Continue to progress workloads to maintain intensity without signs/symptoms of physical distress.       Average METs 6.26         Resistance Training   Training Prescription Yes       Weight 7 lb       Reps 10-15         Interval Training   Interval Training No         Treadmill   MPH 4       Grade 5       Minutes 15       METs 6.82         Elliptical   Level 8       Speed 4       Minutes 15         REL-XR   Level 10       Minutes 15         Oxygen   Maintain Oxygen Saturation 88% or higher                Exercise Comments:   Exercise Comments     Row Name 03/01/23 0821 03/29/23 0900         Exercise Comments First full day of exercise!  Patient was oriented to gym and equipment including functions, settings, policies, and procedures.  Patient's individual exercise prescription and treatment plan were reviewed.  All starting workloads were established based on the results of the 6 minute walk test done at initial orientation visit.  The plan for exercise progression was also introduced and progression will be customized based on patient's performance and goals. THRR was recalculate for Adelayde due to her current work loads and response to exercise. New THRR is 100-145 bpm.               Exercise Goals and Review:   Exercise Goals     Row Name 02/24/23 1154             Exercise Goals   Increase Physical Activity Yes       Intervention Provide advice, education, support and counseling about physical activity/exercise needs.;Develop an individualized exercise prescription for aerobic and resistive training based on initial evaluation findings, risk stratification, comorbidities and participant's personal goals.       Expected Outcomes Short Term: Attend rehab on a regular basis to increase amount of physical activity.;Long Term: Add in home  exercise to make exercise part of routine and to increase amount  of physical activity.;Long Term: Exercising regularly at least 3-5 days a week.       Increase Strength and Stamina Yes       Intervention Provide advice, education, support and counseling about physical activity/exercise needs.;Develop an individualized exercise prescription for aerobic and resistive training based on initial evaluation findings, risk stratification, comorbidities and participant's personal goals.       Expected Outcomes Short Term: Increase workloads from initial exercise prescription for resistance, speed, and METs.;Short Term: Perform resistance training exercises routinely during rehab and add in resistance training at home;Long Term: Improve cardiorespiratory fitness, muscular endurance and strength as measured by increased METs and functional capacity ( )       Able to understand and use rate of perceived exertion (RPE) scale Yes       Intervention Provide education and explanation on how to use RPE scale       Expected Outcomes Short Term: Able to use RPE daily in rehab to express subjective intensity level;Long Term:  Able to use RPE to guide intensity level when exercising independently       Able to understand and use Dyspnea scale Yes       Intervention Provide education and explanation on how to use Dyspnea scale       Expected Outcomes Short Term: Able to use Dyspnea scale daily in rehab to express subjective sense of shortness of breath during exertion;Long Term: Able to use Dyspnea scale to guide intensity level when exercising independently       Knowledge and understanding of Target Heart Rate Range (THRR) Yes       Intervention Provide education and explanation of THRR including how the numbers were predicted and where they are located for reference       Expected Outcomes Short Term: Able to state/look up THRR;Long Term: Able to use THRR to govern intensity when exercising independently;Short Term:  Able to use daily as guideline for intensity in rehab       Able to check pulse independently Yes       Intervention Provide education and demonstration on how to check pulse in carotid and radial arteries.;Review the importance of being able to check your own pulse for safety during independent exercise       Expected Outcomes Short Term: Able to explain why pulse checking is important during independent exercise;Long Term: Able to check pulse independently and accurately       Understanding of Exercise Prescription Yes       Intervention Provide education, explanation, and written materials on patient's individual exercise prescription       Expected Outcomes Short Term: Able to explain program exercise prescription;Long Term: Able to explain home exercise prescription to exercise independently                Exercise Goals Re-Evaluation :  Exercise Goals Re-Evaluation     Row Name 03/01/23 [phone] 03/10/23 0952 03/25/23 1459 03/29/23 0900 04/05/23 0841     Exercise Goal Re-Evaluation   Exercise Goals Review Able to understand and use rate of perceived exertion (RPE) scale;Knowledge and understanding of Target Heart Rate Range (THRR);Able to understand and use Dyspnea scale;Understanding of Exercise Prescription Increase Physical Activity;Increase Strength and Stamina;Understanding of Exercise Prescription Increase Physical Activity;Increase Strength and Stamina;Understanding of Exercise Prescription -- Increase Physical Activity;Increase Strength and Stamina;Understanding of Exercise Prescription   Comments Reviewed RPE and dyspnea scale, THR and program prescription with pt today.  Pt voiced understanding and was given a copy of  goals to take home. Klair is off to a good start in the program. She did well on the treadmill during her first two sessions at a speed of 3.5 mph and an incline of 1%. She also worked on the elliptical at level 2 and the XR at level 9. We will continue to monitor her  progress in the program. Syrah continues to make improvements in the program. She has been able to increase her level on the elliptical from level 2 to 6. She has also increased her speed on the treadmill from 3. to 3.36mph. We will continue to monitor her progress in the program. THRR was recalculate for Keelah due to her current work loads and response to exercise. New THRR is 100-145 bpm. Verlina continues to make improvements in rehab. She has recently been able to increase her workload on the treadmill to a speed of 4.7 mph and an incline of 3.5%. She also has been able to increase her level on the XR from level 9 to level 10. We will continue to monitor her progress in the program.   Expected Outcomes Short: Use RPE daily to regulate intensity. Long: Follow program prescription in THR. Short: Continue to follow current exercise prescription. Long: Continue exercise to improve strength and stamina. Short: Continue to follow current exercise prescription. Long: Continue exercise to improve strength and stamina. -- Short: Continue to follow current exercise prescription. Long: Continue exercise to improve strength and stamina.    Row Name 04/22/23 0846             Exercise Goal Re-Evaluation   Exercise Goals Review Increase Physical Activity;Increase Strength and Stamina;Understanding of Exercise Prescription       Comments Wende is doing well in rehab. She recently increased her incline on the treadmill to 5% while maintaining a speed of 4 mph. She also improved to level 8 on the elliptical and continues to work at level 10 on the XR. We will continue to monitor her progress in the program.       Expected Outcomes Short: Continue to progressively increase treadmill workload. Long: Continue exercise to improve strength and stamina.                Discharge Exercise Prescription (Final Exercise Prescription Changes):  Exercise Prescription Changes - 04/22/23 0800       Response to Exercise    Blood Pressure (Admit) 110/64    Blood Pressure (Exercise) 120/82    Blood Pressure (Exit) 98/58    Heart Rate (Admit) 85 bpm    Heart Rate (Exercise) 150 bpm    Heart Rate (Exit) 99 bpm    Rating of Perceived Exertion (Exercise) 13    Symptoms none    Duration Continue with 30 min of aerobic exercise without signs/symptoms of physical distress.    Intensity THRR unchanged      Progression   Progression Continue to progress workloads to maintain intensity without signs/symptoms of physical distress.    Average METs 6.26      Resistance Training   Training Prescription Yes    Weight 7 lb    Reps 10-15      Interval Training   Interval Training No      Treadmill   MPH 4    Grade 5    Minutes 15    METs 6.82      Elliptical   Level 8    Speed 4    Minutes 15      REL-XR  Level 10    Minutes 15      Oxygen   Maintain Oxygen Saturation 88% or higher             Nutrition:  Target Goals: Understanding of nutrition guidelines, daily intake of sodium 1500mg , cholesterol 200mg , calories 30% from fat and 7% or less from saturated fats, daily to have 5 or more servings of fruits and vegetables.  Education: All About Nutrition: -Group instruction provided by verbal, written material, interactive activities, discussions, models, and posters to present general guidelines for heart healthy nutrition including fat, fiber, MyPlate, the role of sodium in heart healthy nutrition, utilization of the nutrition label, and utilization of this knowledge for meal planning. Follow up email sent as well. Written material given at graduation. Flowsheet Row Cardiac Rehab from 03/31/2023 in [facility] Cardiac and Pulmonary Rehab  Date 03/24/23  Educator JG  Instruction Review Code 1- Verbalizes Understanding       Biometrics:  Pre Biometrics - 02/24/23 1156       Pre Biometrics   Height 5' 4.5" (1.638 m)    Weight 124 lb (56.2 kg)    Waist Circumference 30.5 inches    Hip  Circumference 37 inches    Waist to Hip Ratio 0.82 %    BMI (Calculated) 20.96    Single Leg Stand 30 seconds              Nutrition Therapy Plan and Nutrition Goals:  Nutrition Therapy & Goals - 02/24/23 1507       Nutrition Therapy   Diet cardiac, Low na    Protein (specify units) 75    Fiber 25 grams    Whole Grain Foods 3 servings    Saturated Fats 15 max. grams    Fruits and Vegetables 5 servings/day    Sodium 2 grams      Personal Nutrition Goals   Nutrition Goal Drink 64oz of water    Personal Goal #2 Continue to build balanced plates and meet nutrition goals    Personal Goal #3 When snacking ask why.    Comments Patient drinking 45oz of water, set goal to aim for ~64oz daily. She is vegetarian and has structured eating patterns. She eats lots of fruits and veggies, includes good sources of plant based protein as well as some dairy and eggs in moderation. Reviewed Mediterranean diet handout, types of fats, sources, and how to read labels. Went over a few fact labels as well, educated on ways to keep sodium intake less than 2300mg . Built out meals and snacks focused on meeting needs with vegetarian friendly foods that she likes and will eat.      Intervention Plan   Intervention Prescribe, educate and counsel regarding individualized specific dietary modifications aiming towards targeted core components such as weight, hypertension, lipid management, diabetes, heart failure and other comorbidities.;Nutrition handout(s) given to patient.    Expected Outcomes Short Term Goal: Understand basic principles of dietary content, such as calories, fat, sodium, cholesterol and nutrients.;Short Term Goal: A plan has been developed with personal nutrition goals set during dietitian appointment.;Long Term Goal: Adherence to prescribed nutrition plan.             Nutrition Assessments:  MEDIFICTS Score Key: >=70 Need to make dietary changes  40-70 Heart Healthy Diet <= 40  Therapeutic Level Cholesterol Diet  Flowsheet Row Cardiac Rehab from 02/18/2023 in [facility] Cardiac and Pulmonary Rehab  Picture Your Plate Total Score on Admission 85  Picture Your Plate Scores: <16 Unhealthy dietary pattern with much room for improvement. 41-50 Dietary pattern unlikely to meet recommendations for good health and room for improvement. 51-60 More healthful dietary pattern, with some room for improvement.  >60 Healthy dietary pattern, although there may be some specific behaviors that could be improved.    Nutrition Goals Re-Evaluation:  Nutrition Goals Re-Evaluation     Row Name 03/31/23 0747             Goals   Comment Adline is using a system that helps her track how much water she is drinking. Her bottle has a measure on it to keep track of how much water she is eating. She has no other questions with her diet at this time.       Expected Outcome Short: continue to drink more water. Long: maintain a diet that adheres to her.                Nutrition Goals Discharge (Final Nutrition Goals Re-Evaluation):  Nutrition Goals Re-Evaluation - 03/31/23 0747       Goals   Comment Bambi is using a system that helps her track how much water she is drinking. Her bottle has a measure on it to keep track of how much water she is eating. She has no other questions with her diet at this time.    Expected Outcome Short: continue to drink more water. Long: maintain a diet that adheres to her.             Psychosocial: Target Goals: Acknowledge presence or absence of significant depression and/or stress, maximize coping skills, provide positive support system. Participant is able to verbalize types and ability to use techniques and skills needed for reducing stress and depression.   Education: Stress, Anxiety, and Depression - Group verbal and visual presentation to define topics covered.  Reviews how body is impacted by stress, anxiety, and depression.  Also  discusses healthy ways to reduce stress and to treat/manage anxiety and depression.  Written material given at graduation.   Education: Sleep Hygiene -Provides group verbal and written instruction about how sleep can affect your health.  Define sleep hygiene, discuss sleep cycles and impact of sleep habits. Review good sleep hygiene tips.    Initial Review & Psychosocial Screening:  Initial Psych Review & Screening - 02/18/23 1437       Initial Review   Current issues with Current Stress Concerns    Source of Stress Concerns Financial      Family Dynamics   Good Support System? Yes      Screening Interventions   Interventions Encouraged to exercise;Provide feedback about the scores to participant;To provide support and resources with identified psychosocial needs    Expected Outcomes Long Term Goal: Stressors or current issues are controlled or eliminated.;Short Term goal: Utilizing psychosocial counselor, staff and physician to assist with identification of specific Stressors or current issues interfering with healing process. Setting desired goal for each stressor or current issue identified.;Short Term goal: Identification and review with participant of any Quality of Life or Depression concerns found by scoring the questionnaire.;Long Term goal: The participant improves quality of Life and PHQ9 Scores as seen by post scores and/or verbalization of changes             Quality of Life Scores:   Quality of Life - 02/18/23 1503       Quality of Life   Select Quality of Life  Quality of Life Scores   Health/Function Pre 24.5 %    Socioeconomic Pre 23.06 %    Psych/Spiritual Pre 22.5 %    Family Pre 27.6 %    GLOBAL Pre 24.21 %            Scores of 19 and below usually indicate a poorer quality of life in these areas.  A difference of  2-3 points is a clinically meaningful difference.  A difference of 2-3 points in the total score of the Quality of Life Index has been  associated with significant improvement in overall quality of life, self-image, physical symptoms, and general health in studies assessing change in quality of life.  PHQ-9: Review Flowsheet       02/24/2023  Depression screen PHQ 2/9  Decreased Interest 0  Down, Depressed, Hopeless 0  PHQ - 2 Score 0  Altered sleeping 0  Tired, decreased energy 0  Change in appetite 0  Feeling bad or failure about yourself  0  Trouble concentrating 0  Moving slowly or fidgety/restless 0  Suicidal thoughts 0  PHQ-9 Score 0  Difficult doing work/chores Not difficult at all   Interpretation of Total Score  Total Score Depression Severity:  1-4 = Minimal depression, 5-9 = Mild depression, 10-14 = Moderate depression, 15-19 = Moderately severe depression, 20-27 = Severe depression   Psychosocial Evaluation and Intervention:  Psychosocial Evaluation - 02/18/23 1452       Psychosocial Evaluation & Interventions   Interventions Encouraged to exercise with the program and follow exercise prescription;Stress management education;Relaxation education    Comments 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 She feels like she is recovering well. She is usually a very active person who enjoys walking and especially swimming. She was instructed not to return to those activities at her previous pace until she starts cardiac rehab, so she is very ready to start the program. When asked about her mental health, she states that there is some stress related to whether or not she should retire and the financial involvement. She has a great support system.    Expected Outcomes Short: attend cardiac rehab for education and exercise. Long: Develop and maintain positive self care habits    Continue Psychosocial Services  Follow up required by staff             Psychosocial Re-Evaluation:  Psychosocial Re-Evaluation     Row Name 03/31/23 [phone]             Psychosocial Re-Evaluation   Current  issues with None Identified       Comments Patient reports no issues with their current mental states, sleep, stress, depression or anxiety. Will follow up with patient in a few weeks for any changes.       Expected Outcomes Short: Continue to exercise regularly to support mental health and notify staff of any changes. Long: maintain mental health and well being through teaching of rehab or prescribed medications independently.       Interventions Encouraged to attend Cardiac Rehabilitation for the exercise       Continue Psychosocial Services  Follow up required by staff                Psychosocial Discharge (Final Psychosocial Re-Evaluation):  Psychosocial Re-Evaluation - 03/31/23 0746       Psychosocial Re-Evaluation   Current issues with None Identified    Comments Patient reports no issues with their current mental states, sleep, stress, depression or  anxiety. Will follow up with patient in a few weeks for any changes.    Expected Outcomes Short: Continue to exercise regularly to support mental health and notify staff of any changes. Long: maintain mental health and well being through teaching of rehab or prescribed medications independently.    Interventions Encouraged to attend Cardiac Rehabilitation for the exercise    Continue Psychosocial Services  Follow up required by staff             Vocational Rehabilitation: Provide vocational rehab assistance to qualifying candidates.   Vocational Rehab Evaluation & Intervention:  Vocational Rehab - 02/18/23 1437       Initial Vocational Rehab Evaluation & Intervention   Assessment shows need for Vocational Rehabilitation No             Education: Education Goals: Education classes will be provided on a variety of topics geared toward better understanding of heart health and risk factor modification. Participant will state understanding/return demonstration of topics presented as noted by education test  scores.  Learning Barriers/Preferences:  Learning Barriers/Preferences - 02/18/23 1437       Learning Barriers/Preferences   Learning Barriers None    Learning Preferences None             General Cardiac Education Topics:  AED/CPR: - Group verbal and written instruction with the use of models to demonstrate the basic use of the AED with the basic ABC's of resuscitation.   Anatomy and Cardiac Procedures: - Group verbal and visual presentation and models provide information about basic cardiac anatomy and function. Reviews the testing methods done to diagnose heart disease and the outcomes of the test results. Describes the treatment choices: Medical Management, Angioplasty, or Coronary Bypass Surgery for treating various heart conditions including Myocardial Infarction, Angina, Valve Disease, and Cardiac Arrhythmias.  Written material given at graduation. Flowsheet Row Cardiac Rehab from 03/31/2023 in [facility] Cardiac and Pulmonary Rehab  Date 03/31/23  Educator SB  Instruction Review Code 1- Verbalizes Understanding       Medication Safety: - Group verbal and visual instruction to review commonly prescribed medications for heart and lung disease. Reviews the medication, class of the drug, and side effects. Includes the steps to properly store meds and maintain the prescription regimen.  Written material given at graduation.   Intimacy: - Group verbal instruction through game format to discuss how heart and lung disease can affect sexual intimacy. Written material given at graduation..   Know Your Numbers and Heart Failure: - Group verbal and visual instruction to discuss disease risk factors for cardiac and pulmonary disease and treatment options.  Reviews associated critical values for Overweight/Obesity, Hypertension, Cholesterol, and Diabetes.  Discusses basics of heart failure: signs/symptoms and treatments.  Introduces Heart Failure Zone chart for action plan for heart  failure.  Written material given at graduation. Flowsheet Row Cardiac Rehab from 03/31/2023 in [facility] Cardiac and Pulmonary Rehab  Education need identified 02/24/23       Infection Prevention: - Provides verbal and written material to individual with discussion of infection control including proper hand washing and proper equipment cleaning during exercise session. Flowsheet Row Cardiac Rehab from 03/31/2023 in [facility] Cardiac and Pulmonary Rehab  Date 02/24/23  Educator [facility]  Instruction Review Code 1- Verbalizes Understanding       Falls Prevention: - Provides verbal and written material to individual with discussion of falls prevention and safety. Flowsheet Row Cardiac Rehab from 03/31/2023 in [facility] Cardiac and Pulmonary Rehab  Date 02/24/23  Educator KH  Instruction Review Code 1- Verbalizes Understanding       Other: -Provides group and verbal instruction on various topics (see comments)   Knowledge Questionnaire Score:  Knowledge Questionnaire Score - 02/18/23 1503       Knowledge Questionnaire Score   Pre Score 24/26             Core Components/Risk Factors/Patient Goals at Admission:  Personal Goals and Risk Factors at Admission - 02/24/23 1157       Core Components/Risk Factors/Patient Goals on Admission    Weight Management Yes    Intervention Weight Management: Develop a combined nutrition and exercise program designed to reach desired caloric intake, while maintaining appropriate intake of nutrient and fiber, sodium and fats, and appropriate energy expenditure required for the weight goal.;Weight Management: Provide education and appropriate resources to help participant work on and attain dietary goals.;Weight Management/Obesity: Establish reasonable short term and long term weight goals.    Admit Weight 124 lb (56.2 kg)    Goal Weight: Short Term 124 lb (56.2 kg)    Goal Weight: Long Term 124 lb (56.2 kg)    Expected Outcomes Short Term: Continue to assess  and modify interventions until short term weight is achieved;Long Term: Adherence to nutrition and physical activity/exercise program aimed toward attainment of established weight goal;Weight Maintenance: Understanding of the daily nutrition guidelines, which includes 25-35% calories from fat, 7% or less cal from saturated fats, less than 200mg  cholesterol, less than 1.5gm of sodium, & 5 or more servings of fruits and vegetables daily;Understanding recommendations for meals to include 15-35% energy as protein, 25-35% energy from fat, 35-60% energy from carbohydrates, less than 200mg  of dietary cholesterol, 20-35 gm of total fiber daily;Understanding of distribution of calorie intake throughout the day with the consumption of 4-5 meals/snacks    Hypertension Yes    Intervention Provide education on lifestyle modifcations including regular physical activity/exercise, weight management, moderate sodium restriction and increased consumption of fresh fruit, vegetables, and low fat dairy, alcohol moderation, and smoking cessation.;Monitor prescription use compliance.    Expected Outcomes Short Term: Continued assessment and intervention until BP is < 140/24mm HG in hypertensive participants. < 130/65mm HG in hypertensive participants with diabetes, heart failure or chronic kidney disease.;Long Term: Maintenance of blood pressure at goal levels.    Lipids Yes    Intervention Provide education and support for participant on nutrition & aerobic/resistive exercise along with prescribed medications to achieve LDL 70mg , HDL >40mg .    Expected Outcomes Short Term: Participant states understanding of desired cholesterol values and is compliant with medications prescribed. Participant is following exercise prescription and nutrition guidelines.;Long Term: Cholesterol controlled with medications as prescribed, with individualized exercise RX and with personalized nutrition plan. Value goals: LDL < 70mg , HDL > 40 mg.              Education:Diabetes - Individual verbal and written instruction to review signs/symptoms of diabetes, desired ranges of glucose level fasting, after meals and with exercise. Acknowledge that pre and post exercise glucose checks will be done for 3 sessions at entry of program.   Core Components/Risk Factors/Patient Goals Review:   Goals and Risk Factor Review     Row Name 03/31/23 0749             Core Components/Risk Factors/Patient Goals Review   Personal Goals Review Weight Management/Obesity;Other       Review Uarda is doing well in the program and is maintaining her weight. Her blood pressure  is doing well and is usually never hypertensive. She has no questions on her medications and will continue to exercise.       Expected Outcomes Short: continue exercise. Long: maintain exercise post [facility].                Core Components/Risk Factors/Patient Goals at Discharge (Final Review):   Goals and Risk Factor Review - 03/31/23 0749       Core Components/Risk Factors/Patient Goals Review   Personal Goals Review Weight Management/Obesity;Other    Review Allianah is doing well in the program and is maintaining her weight. Her blood pressure is doing well and is usually never hypertensive. She has no questions on her medications and will continue to exercise.    Expected Outcomes Short: continue exercise. Long: maintain exercise post [facility].             ITP Comments:  ITP Comments     Row Name 02/18/23 1446 02/24/23 1139 03/01/23 0821 03/10/23 1517 03/31/23 1118   ITP Comments Initial phone call completed. Diagnosis can be found in [facility] 9/12. EP Orientation scheduled for Wednesday 10/16 at 10am. Completed and gym orientation. Initial ITP created and sent for review to Dr. Bethann Punches, Medical Director. First full day of exercise!  Patient was oriented to gym and equipment including functions, settings, policies, and procedures.  Patient's individual exercise  prescription and treatment plan were reviewed.  All starting workloads were established based on the results of the 6 minute walk test done at initial orientation visit.  The plan for exercise progression was also introduced and progression will be customized based on patient's performance and goals. 30 Day review completed. Medical Director ITP review done, changes made as directed, and signed approval by Medical Director.     new to program 30 Day review completed. Medical Director ITP review done, changes made as directed, and signed approval by Medical Director.    Row Name 04/28/23 1137           ITP Comments 30 Day review completed. Medical Director ITP review done, changes made as directed, and signed approval by Medical Director.                Comments:

## 2023-04-30 ENCOUNTER — Encounter: Payer: 59 | Admitting: *Deleted

## 2023-04-30 DIAGNOSIS — Z955 Presence of coronary angioplasty implant and graft: Secondary | ICD-10-CM

## 2023-04-30 DIAGNOSIS — Z48812 Encounter for surgical aftercare following surgery on the circulatory system: Secondary | ICD-10-CM | POA: Diagnosis not present

## 2023-04-30 NOTE — Progress Notes (Signed)
Daily Session Note  Patient Details  Name: Brandi Roach MRN: [id number] Date of Birth: August 13, 1955 Referring Provider:   Flowsheet Row Cardiac Rehab from 02/24/2023 in [facility] Cardiac and Pulmonary Rehab  Referring Provider Dr. Youlanda Mighty       Encounter Date: 04/30/2023  Check In:  Session Check In - 04/30/23 0810       Check-In   Supervising physician immediately available to respond to emergencies See telemetry face sheet for immediately available ER MD    Location [facility]-Cardiac & Pulmonary Rehab    Staff Present Cora Collum, RN, BSN, CCRP;Noah Tickle, BS, Exercise Physiologist;Joseph [place], [place]    Virtual Visit No    Medication changes reported     No    Fall or balance concerns reported    No    Warm-up and Cool-down Performed on first and last piece of equipment    Resistance Training Performed Yes    VAD Patient? No    PAD/SET Patient? No      Pain Assessment   Currently in Pain? No/denies                Social History   Tobacco Use  Smoking Status Never  Smokeless Tobacco Never    Goals Met:  Independence with exercise equipment Exercise tolerated well No report of concerns or symptoms today  Goals Unmet:  Not Applicable  Comments: Pt able to follow exercise prescription today without complaint.  Will continue to monitor for progression.    Dr. Bethann Punches is Medical Director for [facility] Cardiac Rehabilitation.  Dr. Vida Rigger is Medical Director for [facility] Pulmonary Rehabilitation.

## 2023-05-10 ENCOUNTER — Encounter: Payer: 59 | Admitting: *Deleted

## 2023-05-10 DIAGNOSIS — Z48812 Encounter for surgical aftercare following surgery on the circulatory system: Secondary | ICD-10-CM | POA: Diagnosis not present

## 2023-05-10 DIAGNOSIS — Z955 Presence of coronary angioplasty implant and graft: Secondary | ICD-10-CM

## 2023-05-10 NOTE — Progress Notes (Signed)
Daily Session Note  Patient Details  Name: Brandi Roach MRN: [id number] Date of Birth: 10-07-1955 Referring Provider:   Flowsheet Row Cardiac Rehab from 02/24/2023 in [facility] Cardiac and Pulmonary Rehab  Referring Provider Dr. Youlanda Mighty       Encounter Date: 05/10/2023  Check In:  Session Check In - 05/10/23 0750       Check-In   Supervising physician immediately available to respond to emergencies See telemetry face sheet for immediately available ER MD    Location [facility]-Cardiac & Pulmonary Rehab    Staff Present Ronette Deter, BS, Exercise Physiologist;Jason Wallace [place], [place], LDN;Joseph Shelbie Proctor, RN, [place]    Virtual Visit No    Medication changes reported     No    Fall or balance concerns reported    No    Warm-up and Cool-down Performed on first and last piece of equipment    Resistance Training Performed Yes    VAD Patient? No    PAD/SET Patient? No      Pain Assessment   Currently in Pain? No/denies                Social History   Tobacco Use  Smoking Status Never  Smokeless Tobacco Never    Goals Met:  Independence with exercise equipment Exercise tolerated well No report of concerns or symptoms today Strength training completed today  Goals Unmet:  Not Applicable  Comments: Pt able to follow exercise prescription today without complaint.  Will continue to monitor for progression.    Dr. Bethann Punches is Medical Director for [facility] Cardiac Rehabilitation.  Dr. Vida Rigger is Medical Director for [facility] Pulmonary Rehabilitation.

## 2023-05-19 ENCOUNTER — Encounter: Payer: 59 | Attending: Cardiovascular Disease | Admitting: *Deleted

## 2023-05-19 DIAGNOSIS — Z48812 Encounter for surgical aftercare following surgery on the circulatory system: Secondary | ICD-10-CM | POA: Insufficient documentation

## 2023-05-19 DIAGNOSIS — Z955 Presence of coronary angioplasty implant and graft: Secondary | ICD-10-CM | POA: Diagnosis present

## 2023-05-19 NOTE — Progress Notes (Signed)
 Daily Session Note  Patient Details  Name: Brandi Roach MRN: [id number] Date of Birth: 10-Jan-1956 Referring Provider:   Flowsheet Row Cardiac Rehab from 02/24/2023 in [facility] Cardiac and Pulmonary Rehab  Referring Provider Dr. Ozell Hays       Encounter Date: 05/19/2023  Check In:  Session Check In - 05/19/23 0730       Check-In   Supervising physician immediately available to respond to emergencies See telemetry face sheet for immediately available ER MD    Location [facility]-Cardiac & Pulmonary Rehab    Staff Present Devaughn Jaeger, BS, Exercise Physiologist;Susanne Bice, RN, BSN, CCRP;Joseph Hood, [place] Arzella Claudene, RN, [place]    Virtual Visit No    Medication changes reported     No    Fall or balance concerns reported    No    Warm-up and Cool-down Performed on first and last piece of equipment    Resistance Training Performed Yes    VAD Patient? No    PAD/SET Patient? No      Pain Assessment   Currently in Pain? No/denies                Social History   Tobacco Use  Smoking Status Never  Smokeless Tobacco Never    Goals Met:  Independence with exercise equipment Exercise tolerated well No report of concerns or symptoms today Strength training completed today  Goals Unmet:  Not Applicable  Comments: Pt able to follow exercise prescription today without complaint.  Will continue to monitor for progression.    Dr. Oneil Pinal is Medical Director for [facility] Cardiac Rehabilitation.  Dr. Fuad Aleskerov is Medical Director for [facility] Pulmonary Rehabilitation.

## 2023-05-26 ENCOUNTER — Encounter: Payer: 59 | Admitting: *Deleted

## 2023-05-26 DIAGNOSIS — Z955 Presence of coronary angioplasty implant and graft: Secondary | ICD-10-CM

## 2023-05-26 DIAGNOSIS — Z48812 Encounter for surgical aftercare following surgery on the circulatory system: Secondary | ICD-10-CM | POA: Diagnosis not present

## 2023-05-26 NOTE — Progress Notes (Signed)
 Cardiac Individual Treatment Plan  Patient Details  Name: Jadzia Kreisel MRN: [id number] Date of Birth: 1955/08/01 Referring Provider:   Flowsheet Row Cardiac Rehab from 02/24/2023 in [facility] Cardiac and Pulmonary Rehab  Referring Provider Dr. Corbett Desanctis       Initial Encounter Date:  Flowsheet Row Cardiac Rehab from 02/24/2023 in [facility] Cardiac and Pulmonary Rehab  Date 02/24/23       Visit Diagnosis: Status post coronary artery stent placement  Patient's Home Medications on Admission:  Current Outpatient Medications:    aspirin EC 81 MG tablet, Take 1 tablet by mouth daily., Disp: , Rfl:    atorvastatin (LIPITOR) 80 MG tablet, Take 80 mg by mouth daily., Disp: , Rfl:    Cholecalciferol (VITAMIN D ) 50 MCG (2000 UT) tablet, Take 2,000 Units by mouth daily., Disp: , Rfl:    Cholecalciferol 10 MCG (400 UNIT) CAPS, Take 400 Units by mouth daily., Disp: , Rfl:    clopidogrel (PLAVIX) 75 MG tablet, Take 75 mg by mouth daily., Disp: , Rfl:    cyclobenzaprine  (FLEXERIL ) 5 MG tablet, Take 1 tablet (5 mg total) by mouth 3 (three) times daily as needed for muscle spasms. (Patient not taking: Reported on 06/30/2021), Disp: 30 tablet, Rfl: 1   denosumab (PROLIA) 60 MG/ML SOSY injection, Inject 60 mg into the skin once., Disp: , Rfl:    estradiol (ESTRACE) 0.1 MG/GM vaginal cream, PLACE 1 GRAM VAGINALLY 2 TIMES A WEEK, Disp: , Rfl:    ezetimibe (ZETIA) 10 MG tablet, Take 1 tablet by mouth daily., Disp: , Rfl:    fluorouracil (EFUDEX) 5 % cream, Apply topically., Disp: , Rfl:    lisinopril (ZESTRIL) 5 MG tablet, Take by mouth. (Patient not taking: Reported on 02/18/2023), Disp: , Rfl:    losartan (COZAAR) 100 MG tablet, Take 100 mg by mouth daily., Disp: , Rfl:    meloxicam  (MOBIC ) 7.5 MG tablet, Take 1 tablet (7.5 mg total) by mouth daily. (Patient not taking: Reported on 06/30/2021), Disp: 10 tablet, Rfl: 0  Past Medical History: No past medical history on file.  Tobacco Use: Social History    Tobacco Use  Smoking Status Never  Smokeless Tobacco Never    Labs: Review Flowsheet       Latest Ref Rng & Units 09/15/2019 06/23/2021 07/13/2022  Labs for ITP Cardiac and Pulmonary Rehab  Cholestrol 100 - 199 mg/dL 409  811  914   LDL (calc) 0 - 99 mg/dL 69  85  68   HDL-C >78 mg/dL 85  95  84   Trlycerides 0 - 149 mg/dL 69  70  65      Exercise Target Goals: Exercise Program Goal: Individual exercise prescription set using results from initial 6 min walk test and THRR while considering  patient's activity barriers and safety.   Exercise Prescription Goal: Initial exercise prescription builds to 30-45 minutes a day of aerobic activity, 2-3 days per week.  Home exercise guidelines will be given to patient during program as part of exercise prescription that the participant will acknowledge.   Education: Aerobic Exercise: - Group verbal and visual presentation on the components of exercise prescription. Introduces F.I.T.T principle from ACSM for exercise prescriptions.  Reviews F.I.T.T. principles of aerobic exercise including progression. Written material given at graduation.   Education: Resistance Exercise: - Group verbal and visual presentation on the components of exercise prescription. Introduces F.I.T.T principle from ACSM for exercise prescriptions  Reviews F.I.T.T. principles of resistance exercise including progression. Written material given at  graduation. Flowsheet Row Cardiac Rehab from 03/31/2023 in [facility] Cardiac and Pulmonary Rehab  Date 03/03/23  Educator NT  Instruction Review Code 1- Verbalizes Understanding        Education: Exercise & Equipment Safety: - Individual verbal instruction and demonstration of equipment use and safety with use of the equipment. Flowsheet Row Cardiac Rehab from 03/31/2023 in [facility] Cardiac and Pulmonary Rehab  Date 02/24/23  Educator [facility]  Instruction Review Code 1- Verbalizes Understanding       Education: Exercise Physiology  & General Exercise Guidelines: - Group verbal and written instruction with models to review the exercise physiology of the cardiovascular system and associated critical values. Provides general exercise guidelines with specific guidelines to those with heart or lung disease.  Flowsheet Row Cardiac Rehab from 03/31/2023 in [facility] Cardiac and Pulmonary Rehab  Education need identified 02/24/23       Education: Flexibility, Balance, Mind/Body Relaxation: - Group verbal and visual presentation with interactive activity on the components of exercise prescription. Introduces F.I.T.T principle from ACSM for exercise prescriptions. Reviews F.I.T.T. principles of flexibility and balance exercise training including progression. Also discusses the mind body connection.  Reviews various relaxation techniques to help reduce and manage stress (i.e. Deep breathing, progressive muscle relaxation, and visualization). Balance handout provided to take home. Written material given at graduation. Flowsheet Row Cardiac Rehab from 03/31/2023 in [facility] Cardiac and Pulmonary Rehab  Date 03/03/23  Educator NT  Instruction Review Code 1- Verbalizes Understanding       Activity Barriers & Risk Stratification:  Activity Barriers & Cardiac Risk Stratification - 02/18/23 1432       Activity Barriers & Cardiac Risk Stratification   Activity Barriers None    Cardiac Risk Stratification Moderate             6 Minute Walk:  6 Minute Walk     Row Name 02/24/23 1140         6 Minute Walk   Phase Initial     Distance 1660 feet     Walk Time 6 minutes     # of Rest Breaks 0     MPH 3.14     METS 4.14     RPE 12     Perceived Dyspnea  0     VO2 Peak 14.5     Symptoms No     Resting HR 61 bpm     Resting BP 140/82     Resting Oxygen Saturation  98 %     Exercise Oxygen Saturation  during 6 min walk 95 %     Max Ex. HR 102 bpm     Max Ex. BP 148/82     2 Minute Post BP 124/72              Oxygen  Initial Assessment:   Oxygen Re-Evaluation:   Oxygen Discharge (Final Oxygen Re-Evaluation):   Initial Exercise Prescription:  Initial Exercise Prescription - 02/24/23 1100       Date of Initial Exercise RX and Referring Provider   Date 02/24/23    Referring Provider Dr. Corbett Desanctis      Oxygen   Maintain Oxygen Saturation 88% or higher      Treadmill   MPH 3.5    Grade 1    Minutes 15    METs 4.16      Elliptical   Level 1    Speed 3    Minutes 15    METs 4.14  REL-XR   Level 3    Speed 50    Minutes 15    METs 4.14      Prescription Details   Frequency (times per week) 3    Duration Progress to 30 minutes of continuous aerobic without signs/symptoms of physical distress      Intensity   THRR 40-80% of Max Heartrate 97-134    Ratings of Perceived Exertion 11-13    Perceived Dyspnea 0-4      Progression   Progression Continue to progress workloads to maintain intensity without signs/symptoms of physical distress.      Resistance Training   Training Prescription Yes    Weight 7    Reps 10-15             Perform Capillary Blood Glucose checks as needed.  Exercise Prescription Changes:   Exercise Prescription Changes     Row Name 02/24/23 1100 03/10/23 0900 03/25/23 1400 03/29/23 0800 04/05/23 0800     Response to Exercise   Blood Pressure (Admit) 140/82 108/66 110/66 -- 132/68   Blood Pressure (Exercise) 148/82 144/62 144/60 -- 172/90   Blood Pressure (Exit) 124/72 106/68 102/60 -- 114/60   Heart Rate (Admit) 61 bpm 65 bpm 70 bpm -- 85 bpm   Heart Rate (Exercise) 102 bpm 150 bpm 140 bpm -- 164 bpm   Heart Rate (Exit) 70 bpm 71 bpm 95 bpm -- 116 bpm   Oxygen Saturation (Admit) 98 % -- -- -- --   Oxygen Saturation (Exercise) 95 % -- -- -- --   Oxygen Saturation (Exit) 98 % -- -- -- --   Rating of Perceived Exertion (Exercise) 12 13 13  -- 14   Perceived Dyspnea (Exercise) 0 -- 0 -- 0   Symptoms none none none -- none   Comments 6  MWT results First two days of exercise -- -- --   Duration -- Progress to 30 minutes of  aerobic without signs/symptoms of physical distress Progress to 30 minutes of  aerobic without signs/symptoms of physical distress Progress to 30 minutes of  aerobic without signs/symptoms of physical distress Progress to 30 minutes of  aerobic without signs/symptoms of physical distress   Intensity -- THRR unchanged THRR unchanged THRR New  100-145 THRR unchanged     Progression   Progression -- Continue to progress workloads to maintain intensity without signs/symptoms of physical distress. Continue to progress workloads to maintain intensity without signs/symptoms of physical distress. Continue to progress workloads to maintain intensity without signs/symptoms of physical distress. Continue to progress workloads to maintain intensity without signs/symptoms of physical distress.   Average METs -- 4.04 5.22 5.22 5.22     Resistance Training   Training Prescription -- Yes Yes Yes Yes   Weight -- 7 lb 7 lb 7 lb 7 lb   Reps -- 10-15 10-15 10-15 10-15     Interval Training   Interval Training -- No No No No     Treadmill   MPH -- 3.5 3.7 3.7 4.7   Grade -- 1 1 1  3.5   Minutes -- 15 15 15 15    METs -- 4.16 4.34 4.34 6.87     Elliptical   Level -- 2 6 6 7    Speed -- 3 3 3 4    Minutes -- 15 15 15 15    METs -- -- 6.1 6.1 7.6     REL-XR   Level -- 9 -- -- 10   Minutes -- 15 -- -- 15  METs -- -- -- -- 5.1     Oxygen   Maintain Oxygen Saturation -- 88% or higher 88% or higher 88% or higher 88% or higher    Row Name 04/22/23 0800 05/04/23 1000 05/18/23 1500         Response to Exercise   Blood Pressure (Admit) 110/64 102/64 110/66     Blood Pressure (Exercise) 120/82 -- --     Blood Pressure (Exit) 98/58 120/64 110/70     Heart Rate (Admit) 85 bpm 83 bpm 71 bpm     Heart Rate (Exercise) 150 bpm 158 bpm 146 bpm     Heart Rate (Exit) 99 bpm 99 bpm 109 bpm     Rating of Perceived Exertion  (Exercise) 13 13 13      Symptoms none none none     Duration Continue with 30 min of aerobic exercise without signs/symptoms of physical distress. Continue with 30 min of aerobic exercise without signs/symptoms of physical distress. Continue with 30 min of aerobic exercise without signs/symptoms of physical distress.     Intensity THRR unchanged THRR unchanged THRR unchanged       Progression   Progression Continue to progress workloads to maintain intensity without signs/symptoms of physical distress. Continue to progress workloads to maintain intensity without signs/symptoms of physical distress. Continue to progress workloads to maintain intensity without signs/symptoms of physical distress.     Average METs 6.26 6.08 4.78       Resistance Training   Training Prescription Yes Yes Yes     Weight 7 lb 7 lb 7 lb     Reps 10-15 10-15 10-15       Interval Training   Interval Training No Yes No     Equipment -- Treadmill --     Comments -- every 2 min increasing speed to and incline to 2% --       Treadmill   MPH 4 4 6      Grade 5 14 2      Minutes 15 15 15      METs 6.82 11.78 7.25       Elliptical   Level 8 10 --     Speed 4 3.8 --     Minutes 15 15 --     METs -- 5.5 --       REL-XR   Level 10 9 10      Minutes 15 15 15      METs -- 5.2 2.3       Oxygen   Maintain Oxygen Saturation 88% or higher 88% or higher 88% or higher              Exercise Comments:   Exercise Comments     Row Name 03/01/23 7425 03/29/23 0900         Exercise Comments First full day of exercise!  Patient was oriented to gym and equipment including functions, settings, policies, and procedures.  Patient's individual exercise prescription and treatment plan were reviewed.  All starting workloads were established based on the results of the 6 minute walk test done at initial orientation visit.  The plan for exercise progression was also introduced and progression will be customized based on  patient's performance and goals. THRR was recalculate for Johnene due to her current work loads and response to exercise. New THRR is 100-145 bpm.               Exercise Goals and Review:   Exercise Goals  Row Name 02/24/23 1154             Exercise Goals   Increase Physical Activity Yes       Intervention Provide advice, education, support and counseling about physical activity/exercise needs.;Develop an individualized exercise prescription for aerobic and resistive training based on initial evaluation findings, risk stratification, comorbidities and participant's personal goals.       Expected Outcomes Short Term: Attend rehab on a regular basis to increase amount of physical activity.;Long Term: Add in home exercise to make exercise part of routine and to increase amount of physical activity.;Long Term: Exercising regularly at least 3-5 days a week.       Increase Strength and Stamina Yes       Intervention Provide advice, education, support and counseling about physical activity/exercise needs.;Develop an individualized exercise prescription for aerobic and resistive training based on initial evaluation findings, risk stratification, comorbidities and participant's personal goals.       Expected Outcomes Short Term: Increase workloads from initial exercise prescription for resistance, speed, and METs.;Short Term: Perform resistance training exercises routinely during rehab and add in resistance training at home;Long Term: Improve cardiorespiratory fitness, muscular endurance and strength as measured by increased METs and functional capacity ( )       Able to understand and use rate of perceived exertion (RPE) scale Yes       Intervention Provide education and explanation on how to use RPE scale       Expected Outcomes Short Term: Able to use RPE daily in rehab to express subjective intensity level;Long Term:  Able to use RPE to guide intensity level when exercising independently        Able to understand and use Dyspnea scale Yes       Intervention Provide education and explanation on how to use Dyspnea scale       Expected Outcomes Short Term: Able to use Dyspnea scale daily in rehab to express subjective sense of shortness of breath during exertion;Long Term: Able to use Dyspnea scale to guide intensity level when exercising independently       Knowledge and understanding of Target Heart Rate Range (THRR) Yes       Intervention Provide education and explanation of THRR including how the numbers were predicted and where they are located for reference       Expected Outcomes Short Term: Able to state/look up THRR;Long Term: Able to use THRR to govern intensity when exercising independently;Short Term: Able to use daily as guideline for intensity in rehab       Able to check pulse independently Yes       Intervention Provide education and demonstration on how to check pulse in carotid and radial arteries.;Review the importance of being able to check your own pulse for safety during independent exercise       Expected Outcomes Short Term: Able to explain why pulse checking is important during independent exercise;Long Term: Able to check pulse independently and accurately       Understanding of Exercise Prescription Yes       Intervention Provide education, explanation, and written materials on patient's individual exercise prescription       Expected Outcomes Short Term: Able to explain program exercise prescription;Long Term: Able to explain home exercise prescription to exercise independently                Exercise Goals Re-Evaluation :  Exercise Goals Re-Evaluation     Row Name 03/01/23 [phone] 03/10/23 [phone]  03/25/23 1459 03/29/23 0900 04/05/23 0841     Exercise Goal Re-Evaluation   Exercise Goals Review Able to understand and use rate of perceived exertion (RPE) scale;Knowledge and understanding of Target Heart Rate Range (THRR);Able to understand and use Dyspnea  scale;Understanding of Exercise Prescription Increase Physical Activity;Increase Strength and Stamina;Understanding of Exercise Prescription Increase Physical Activity;Increase Strength and Stamina;Understanding of Exercise Prescription -- Increase Physical Activity;Increase Strength and Stamina;Understanding of Exercise Prescription   Comments Reviewed RPE and dyspnea scale, THR and program prescription with pt today.  Pt voiced understanding and was given a copy of goals to take home. Aamyah is off to a good start in the program. She did well on the treadmill during her first two sessions at a speed of 3.5 mph and an incline of 1%. She also worked on the elliptical at level 2 and the XR at level 9. We will continue to monitor her progress in the program. Loray continues to make improvements in the program. She has been able to increase her level on the elliptical from level 2 to 6. She has also increased her speed on the treadmill from 3. to 3.87mph. We will continue to monitor her progress in the program. THRR was recalculate for Josilyn due to her current work loads and response to exercise. New THRR is 100-145 bpm. Khyli continues to make improvements in rehab. She has recently been able to increase her workload on the treadmill to a speed of 4.7 mph and an incline of 3.5%. She also has been able to increase her level on the XR from level 9 to level 10. We will continue to monitor her progress in the program.   Expected Outcomes Short: Use RPE daily to regulate intensity. Long: Follow program prescription in THR. Short: Continue to follow current exercise prescription. Long: Continue exercise to improve strength and stamina. Short: Continue to follow current exercise prescription. Long: Continue exercise to improve strength and stamina. -- Short: Continue to follow current exercise prescription. Long: Continue exercise to improve strength and stamina.    Row Name 04/22/23 0846 05/04/23 1037 05/18/23 1506          Exercise Goal Re-Evaluation   Exercise Goals Review Increase Physical Activity;Increase Strength and Stamina;Understanding of Exercise Prescription Increase Physical Activity;Increase Strength and Stamina;Understanding of Exercise Prescription Increase Physical Activity;Increase Strength and Stamina;Understanding of Exercise Prescription     Comments Sanyia is doing well in rehab. She recently increased her incline on the treadmill to 5% while maintaining a speed of 4 mph. She also improved to level 8 on the elliptical and continues to work at level 10 on the XR. We will continue to monitor her progress in the program. Asuka continues to do well in rehab. She increased her incline on the treadmill to 14% with a speed of . She also tried intervals on the treadmill increasing every 2 minutes to a speed of with incline of 2%. She also increased to level 10 on the elliptical. We will continue to monitor her progress in the program. Tongia has only attended one session since the last review. During that session she was able to work on the treadmill at a speed of 6 mph with a 2% incline and used the XR at level 10. We will continue to monitor her progress in the program.     Expected Outcomes Short: Continue to progressively increase treadmill workload. Long: Continue exercise to improve strength and stamina. Short: Continue to progressively increase treadmill and elliptical workloads and  trying interval training on the treadmill. Long: Continue exercise to improve strength and stamina. Short: Attend rehab more consistently. Long: Continue exercise to improve strength and stamina.              Discharge Exercise Prescription (Final Exercise Prescription Changes):  Exercise Prescription Changes - 05/18/23 1500       Response to Exercise   Blood Pressure (Admit) 110/66    Blood Pressure (Exit) 110/70    Heart Rate (Admit) 71 bpm    Heart Rate (Exercise) 146 bpm    Heart Rate (Exit) 109 bpm     Rating of Perceived Exertion (Exercise) 13    Symptoms none    Duration Continue with 30 min of aerobic exercise without signs/symptoms of physical distress.    Intensity THRR unchanged      Progression   Progression Continue to progress workloads to maintain intensity without signs/symptoms of physical distress.    Average METs 4.78      Resistance Training   Training Prescription Yes    Weight 7 lb    Reps 10-15      Interval Training   Interval Training No      Treadmill   MPH 6    Grade 2    Minutes 15    METs 7.25      REL-XR   Level 10    Minutes 15    METs 2.3      Oxygen   Maintain Oxygen Saturation 88% or higher             Nutrition:  Target Goals: Understanding of nutrition guidelines, daily intake of sodium 1500mg , cholesterol 200mg , calories 30% from fat and 7% or less from saturated fats, daily to have 5 or more servings of fruits and vegetables.  Education: All About Nutrition: -Group instruction provided by verbal, written material, interactive activities, discussions, models, and posters to present general guidelines for heart healthy nutrition including fat, fiber, MyPlate, the role of sodium in heart healthy nutrition, utilization of the nutrition label, and utilization of this knowledge for meal planning. Follow up email sent as well. Written material given at graduation. Flowsheet Row Cardiac Rehab from 03/31/2023 in [facility] Cardiac and Pulmonary Rehab  Date 03/24/23  Educator JG  Instruction Review Code 1- Verbalizes Understanding       Biometrics:  Pre Biometrics - 02/24/23 1156       Pre Biometrics   Height 5' 4.5" (1.638 m)    Weight 124 lb (56.2 kg)    Waist Circumference 30.5 inches    Hip Circumference 37 inches    Waist to Hip Ratio 0.82 %    BMI (Calculated) 20.96    Single Leg Stand 30 seconds              Nutrition Therapy Plan and Nutrition Goals:  Nutrition Therapy & Goals - 02/24/23 1507       Nutrition  Therapy   Diet cardiac, Low na    Protein (specify units) 75    Fiber 25 grams    Whole Grain Foods 3 servings    Saturated Fats 15 max. grams    Fruits and Vegetables 5 servings/day    Sodium 2 grams      Personal Nutrition Goals   Nutrition Goal Drink 64oz of water    Personal Goal #2 Continue to build balanced plates and meet nutrition goals    Personal Goal #3 When snacking ask why.    Comments Patient drinking 45oz  of water, set goal to aim for ~64oz daily. She is vegetarian and has structured eating patterns. She eats lots of fruits and veggies, includes good sources of plant based protein as well as some dairy and eggs in moderation. Reviewed Mediterranean diet handout, types of fats, sources, and how to read labels. Went over a few fact labels as well, educated on ways to keep sodium intake less than 2300mg . Built out meals and snacks focused on meeting needs with vegetarian friendly foods that she likes and will eat.      Intervention Plan   Intervention Prescribe, educate and counsel regarding individualized specific dietary modifications aiming towards targeted core components such as weight, hypertension, lipid management, diabetes, heart failure and other comorbidities.;Nutrition handout(s) given to patient.    Expected Outcomes Short Term Goal: Understand basic principles of dietary content, such as calories, fat, sodium, cholesterol and nutrients.;Short Term Goal: A plan has been developed with personal nutrition goals set during dietitian appointment.;Long Term Goal: Adherence to prescribed nutrition plan.             Nutrition Assessments:  MEDIFICTS Score Key: >=70 Need to make dietary changes  40-70 Heart Healthy Diet <= 40 Therapeutic Level Cholesterol Diet  Flowsheet Row Cardiac Rehab from 02/18/2023 in [facility] Cardiac and Pulmonary Rehab  Picture Your Plate Total Score on Admission 85      Picture Your Plate Scores: <16 Unhealthy dietary pattern with much room  for improvement. 41-50 Dietary pattern unlikely to meet recommendations for good health and room for improvement. 51-60 More healthful dietary pattern, with some room for improvement.  >60 Healthy dietary pattern, although there may be some specific behaviors that could be improved.    Nutrition Goals Re-Evaluation:  Nutrition Goals Re-Evaluation     Row Name 03/31/23 0747 05/19/23 0731           Goals   Comment Armanda is using a system that helps her track how much water she is drinking. Her bottle has a measure on it to keep track of how much water she is eating. She has no other questions with her diet at this time. Zyian is working on drinking her water more. She has not been as good as she wants to be with her intake. She is going to try to intake more water by using battles with markers on them.      Expected Outcome Short: continue to drink more water. Long: maintain a diet that adheres to her. Short: drink more water. Long: maintain water intake independently.               Nutrition Goals Discharge (Final Nutrition Goals Re-Evaluation):  Nutrition Goals Re-Evaluation - 05/19/23 0731       Goals   Comment Randilynn is working on drinking her water more. She has not been as good as she wants to be with her intake. She is going to try to intake more water by using battles with markers on them.    Expected Outcome Short: drink more water. Long: maintain water intake independently.             Psychosocial: Target Goals: Acknowledge presence or absence of significant depression and/or stress, maximize coping skills, provide positive support system. Participant is able to verbalize types and ability to use techniques and skills needed for reducing stress and depression.   Education: Stress, Anxiety, and Depression - Group verbal and visual presentation to define topics covered.  Reviews how body is impacted by stress,  anxiety, and depression.  Also discusses healthy ways to  reduce stress and to treat/manage anxiety and depression.  Written material given at graduation.   Education: Sleep Hygiene -Provides group verbal and written instruction about how sleep can affect your health.  Define sleep hygiene, discuss sleep cycles and impact of sleep habits. Review good sleep hygiene tips.    Initial Review & Psychosocial Screening:  Initial Psych Review & Screening - 02/18/23 1437       Initial Review   Current issues with Current Stress Concerns    Source of Stress Concerns Financial      Family Dynamics   Good Support System? Yes      Screening Interventions   Interventions Encouraged to exercise;Provide feedback about the scores to participant;To provide support and resources with identified psychosocial needs    Expected Outcomes Long Term Goal: Stressors or current issues are controlled or eliminated.;Short Term goal: Utilizing psychosocial counselor, staff and physician to assist with identification of specific Stressors or current issues interfering with healing process. Setting desired goal for each stressor or current issue identified.;Short Term goal: Identification and review with participant of any Quality of Life or Depression concerns found by scoring the questionnaire.;Long Term goal: The participant improves quality of Life and PHQ9 Scores as seen by post scores and/or verbalization of changes             Quality of Life Scores:   Quality of Life - 02/18/23 1503       Quality of Life   Select Quality of Life      Quality of Life Scores   Health/Function Pre 24.5 %    Socioeconomic Pre 23.06 %    Psych/Spiritual Pre 22.5 %    Family Pre 27.6 %    GLOBAL Pre 24.21 %            Scores of 19 and below usually indicate a poorer quality of life in these areas.  A difference of  2-3 points is a clinically meaningful difference.  A difference of 2-3 points in the total score of the Quality of Life Index has been associated with  significant improvement in overall quality of life, self-image, physical symptoms, and general health in studies assessing change in quality of life.  PHQ-9: Review Flowsheet       02/24/2023  Depression screen PHQ 2/9  Decreased Interest 0  Down, Depressed, Hopeless 0  PHQ - 2 Score 0  Altered sleeping 0  Tired, decreased energy 0  Change in appetite 0  Feeling bad or failure about yourself  0  Trouble concentrating 0  Moving slowly or fidgety/restless 0  Suicidal thoughts 0  PHQ-9 Score 0  Difficult doing work/chores Not difficult at all   Interpretation of Total Score  Total Score Depression Severity:  1-4 = Minimal depression, 5-9 = Mild depression, 10-14 = Moderate depression, 15-19 = Moderately severe depression, 20-27 = Severe depression   Psychosocial Evaluation and Intervention:  Psychosocial Evaluation - 02/18/23 1452       Psychosocial Evaluation & Interventions   Interventions Encouraged to exercise with the program and follow exercise prescription;Stress management education;Relaxation education    Comments 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 She feels like she is recovering well. She is usually a very active person who enjoys walking and especially swimming. She was instructed not to return to those activities at her previous pace until she starts cardiac rehab, so she is very ready to start  the program. When asked about her mental health, she states that there is some stress related to whether or not she should retire and the financial involvement. She has a great support system.    Expected Outcomes Short: attend cardiac rehab for education and exercise. Long: Develop and maintain positive self care habits    Continue Psychosocial Services  Follow up required by staff             Psychosocial Re-Evaluation:  Psychosocial Re-Evaluation     Row Name 03/31/23 0746 05/19/23 0731           Psychosocial Re-Evaluation   Current issues  with None Identified None Identified      Comments Patient reports no issues with their current mental states, sleep, stress, depression or anxiety. Will follow up with patient in a few weeks for any changes. Patient reports no issues with their current mental states, sleep, stress, depression or anxiety. Will follow up with patient in a few weeks for any changes.      Expected Outcomes Short: Continue to exercise regularly to support mental health and notify staff of any changes. Long: maintain mental health and well being through teaching of rehab or prescribed medications independently. Short: Continue to exercise regularly to support mental health and notify staff of any changes. Long: maintain mental health and well being through teaching of rehab or prescribed medications independently.      Interventions Encouraged to attend Cardiac Rehabilitation for the exercise Encouraged to attend Cardiac Rehabilitation for the exercise      Continue Psychosocial Services  Follow up required by staff Follow up required by staff               Psychosocial Discharge (Final Psychosocial Re-Evaluation):  Psychosocial Re-Evaluation - 05/19/23 0731       Psychosocial Re-Evaluation   Current issues with None Identified    Comments Patient reports no issues with their current mental states, sleep, stress, depression or anxiety. Will follow up with patient in a few weeks for any changes.    Expected Outcomes Short: Continue to exercise regularly to support mental health and notify staff of any changes. Long: maintain mental health and well being through teaching of rehab or prescribed medications independently.    Interventions Encouraged to attend Cardiac Rehabilitation for the exercise    Continue Psychosocial Services  Follow up required by staff             Vocational Rehabilitation: Provide vocational rehab assistance to qualifying candidates.   Vocational Rehab Evaluation & Intervention:   Vocational Rehab - 02/18/23 1437       Initial Vocational Rehab Evaluation & Intervention   Assessment shows need for Vocational Rehabilitation No             Education: Education Goals: Education classes will be provided on a variety of topics geared toward better understanding of heart health and risk factor modification. Participant will state understanding/return demonstration of topics presented as noted by education test scores.  Learning Barriers/Preferences:  Learning Barriers/Preferences - 02/18/23 1437       Learning Barriers/Preferences   Learning Barriers None    Learning Preferences None             General Cardiac Education Topics:  AED/CPR: - Group verbal and written instruction with the use of models to demonstrate the basic use of the AED with the basic ABC's of resuscitation.   Anatomy and Cardiac Procedures: - Group verbal and visual presentation and  models provide information about basic cardiac anatomy and function. Reviews the testing methods done to diagnose heart disease and the outcomes of the test results. Describes the treatment choices: Medical Management, Angioplasty, or Coronary Bypass Surgery for treating various heart conditions including Myocardial Infarction, Angina, Valve Disease, and Cardiac Arrhythmias.  Written material given at graduation. Flowsheet Row Cardiac Rehab from 03/31/2023 in [facility] Cardiac and Pulmonary Rehab  Date 03/31/23  Educator SB  Instruction Review Code 1- Verbalizes Understanding       Medication Safety: - Group verbal and visual instruction to review commonly prescribed medications for heart and lung disease. Reviews the medication, class of the drug, and side effects. Includes the steps to properly store meds and maintain the prescription regimen.  Written material given at graduation.   Intimacy: - Group verbal instruction through game format to discuss how heart and lung disease can affect sexual intimacy.  Written material given at graduation..   Know Your Numbers and Heart Failure: - Group verbal and visual instruction to discuss disease risk factors for cardiac and pulmonary disease and treatment options.  Reviews associated critical values for Overweight/Obesity, Hypertension, Cholesterol, and Diabetes.  Discusses basics of heart failure: signs/symptoms and treatments.  Introduces Heart Failure Zone chart for action plan for heart failure.  Written material given at graduation. Flowsheet Row Cardiac Rehab from 03/31/2023 in [facility] Cardiac and Pulmonary Rehab  Education need identified 02/24/23       Infection Prevention: - Provides verbal and written material to individual with discussion of infection control including proper hand washing and proper equipment cleaning during exercise session. Flowsheet Row Cardiac Rehab from 03/31/2023 in [facility] Cardiac and Pulmonary Rehab  Date 02/24/23  Educator [facility]  Instruction Review Code 1- Verbalizes Understanding       Falls Prevention: - Provides verbal and written material to individual with discussion of falls prevention and safety. Flowsheet Row Cardiac Rehab from 03/31/2023 in [facility] Cardiac and Pulmonary Rehab  Date 02/24/23  Educator [facility]  Instruction Review Code 1- Verbalizes Understanding       Other: -Provides group and verbal instruction on various topics (see comments)   Knowledge Questionnaire Score:  Knowledge Questionnaire Score - 02/18/23 1503       Knowledge Questionnaire Score   Pre Score 24/26             Core Components/Risk Factors/Patient Goals at Admission:  Personal Goals and Risk Factors at Admission - 02/24/23 1157       Core Components/Risk Factors/Patient Goals on Admission    Weight Management Yes    Intervention Weight Management: Develop a combined nutrition and exercise program designed to reach desired caloric intake, while maintaining appropriate intake of nutrient and fiber, sodium and fats, and  appropriate energy expenditure required for the weight goal.;Weight Management: Provide education and appropriate resources to help participant work on and attain dietary goals.;Weight Management/Obesity: Establish reasonable short term and long term weight goals.    Admit Weight 124 lb (56.2 kg)    Goal Weight: Short Term 124 lb (56.2 kg)    Goal Weight: Long Term 124 lb (56.2 kg)    Expected Outcomes Short Term: Continue to assess and modify interventions until short term weight is achieved;Long Term: Adherence to nutrition and physical activity/exercise program aimed toward attainment of established weight goal;Weight Maintenance: Understanding of the daily nutrition guidelines, which includes 25-35% calories from fat, 7% or less cal from saturated fats, less than 200mg  cholesterol, less than 1.5gm of sodium, & 5 or more  servings of fruits and vegetables daily;Understanding recommendations for meals to include 15-35% energy as protein, 25-35% energy from fat, 35-60% energy from carbohydrates, less than 200mg  of dietary cholesterol, 20-35 gm of total fiber daily;Understanding of distribution of calorie intake throughout the day with the consumption of 4-5 meals/snacks    Hypertension Yes    Intervention Provide education on lifestyle modifcations including regular physical activity/exercise, weight management, moderate sodium restriction and increased consumption of fresh fruit, vegetables, and low fat dairy, alcohol moderation, and smoking cessation.;Monitor prescription use compliance.    Expected Outcomes Short Term: Continued assessment and intervention until BP is < 140/38mm HG in hypertensive participants. < 130/54mm HG in hypertensive participants with diabetes, heart failure or chronic kidney disease.;Long Term: Maintenance of blood pressure at goal levels.    Lipids Yes    Intervention Provide education and support for participant on nutrition & aerobic/resistive exercise along with prescribed  medications to achieve LDL 70mg , HDL >40mg .    Expected Outcomes Short Term: Participant states understanding of desired cholesterol values and is compliant with medications prescribed. Participant is following exercise prescription and nutrition guidelines.;Long Term: Cholesterol controlled with medications as prescribed, with individualized exercise RX and with personalized nutrition plan. Value goals: LDL < 70mg , HDL > 40 mg.             Education:Diabetes - Individual verbal and written instruction to review signs/symptoms of diabetes, desired ranges of glucose level fasting, after meals and with exercise. Acknowledge that pre and post exercise glucose checks will be done for 3 sessions at entry of program.   Core Components/Risk Factors/Patient Goals Review:   Goals and Risk Factor Review     Row Name 03/31/23 0749 05/19/23 0737           Core Components/Risk Factors/Patient Goals Review   Personal Goals Review Weight Management/Obesity;Other Other      Review Reynaldo is doing well in the program and is maintaining her weight. Her blood pressure is doing well and is usually never hypertensive. She has no questions on her medications and will continue to exercise. Kartina states she has no questionsa about her health at this time and plans to do all 36 sessions of rehab.      Expected Outcomes Short: continue exercise. Long: maintain exercise post [facility]. 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               Core Components/Risk Factors/Patient Goals at Discharge (Final Review):   Goals and Risk Factor Review - 05/19/23 0737       Core Components/Risk Factors/Patient Goals Review   Personal Goals Review Other    Review Rosalva states she has no questionsa about her health at this time and plans to do all 36 sessions of rehab.    Expected Outcomes Short: Graduate [facility]. Long: maintain home exercise post Graduation.             ITP  Comments:  ITP Comments     Row Name 02/18/23 1446 02/24/23 1139 03/01/23 0821 03/10/23 1517 03/31/23 1118   ITP Comments Initial phone call completed. Diagnosis can be found in [facility] 9/12. EP Orientation scheduled for Wednesday 10/16 at 10am. Completed and gym orientation. Initial ITP created and sent for review to Dr. Firman Hughes, Medical Director. First full day of exercise!  Patient was oriented to gym and equipment including functions, settings, policies, and procedures.  Patient's individual exercise prescription and treatment plan were reviewed.  All starting workloads were  established based on the results of the 6 minute walk test done at initial orientation visit.  The plan for exercise progression was also introduced and progression will be customized based on patient's performance and goals. 30 Day review completed. Medical Director ITP review done, changes made as directed, and signed approval by Medical Director.     new to program 30 Day review completed. Medical Director ITP review done, changes made as directed, and signed approval by Medical Director.    Row Name 04/28/23 1137 05/26/23 1504         ITP Comments 30 Day review completed. Medical Director ITP review done, changes made as directed, and signed approval by Medical Director. 30 Day review completed. Medical Director ITP review done, changes made as directed, and signed approval by Medical Director.               Comments: 30 day review

## 2023-05-26 NOTE — Progress Notes (Signed)
 Daily Session Note  Patient Details  Name: Brandi Roach MRN: [id number] Date of Birth: 16-Jun-1955 Referring Provider:   Flowsheet Row Cardiac Rehab from 02/24/2023 in [facility] Cardiac and Pulmonary Rehab  Referring Provider Dr. Corbett Desanctis       Encounter Date: 05/26/2023  Check In:  Session Check In - 05/26/23 0809       Check-In   Supervising physician immediately available to respond to emergencies See telemetry face sheet for immediately available ER MD    Location [facility]-Cardiac & Pulmonary Rehab    Staff Present Maud Sorenson, RN, BSN, CCRP;Joseph Hood RCP,RRT,BSRT;Maxon Sproul BS, Exercise Physiologist;Jason Martina Sledge RDN,LDN    Virtual Visit No    Medication changes reported     No    Fall or balance concerns reported    No    Warm-up and Cool-down Performed on first and last piece of equipment    Resistance Training Performed Yes    VAD Patient? No    PAD/SET Patient? No      Pain Assessment   Currently in Pain? No/denies                Social History   Tobacco Use  Smoking Status Never  Smokeless Tobacco Never    Goals Met:  Independence with exercise equipment Exercise tolerated well No report of concerns or symptoms today  Goals Unmet:  Not Applicable  Comments: Pt able to follow exercise prescription today without complaint.  Will continue to monitor for progression.    Dr. Firman Hughes is Medical Director for [facility] Cardiac Rehabilitation.  Dr. Fuad Aleskerov is Medical Director for [facility] Pulmonary Rehabilitation.

## 2023-06-04 ENCOUNTER — Encounter: Payer: 59 | Admitting: *Deleted

## 2023-06-04 DIAGNOSIS — Z955 Presence of coronary angioplasty implant and graft: Secondary | ICD-10-CM

## 2023-06-04 DIAGNOSIS — Z48812 Encounter for surgical aftercare following surgery on the circulatory system: Secondary | ICD-10-CM | POA: Diagnosis not present

## 2023-06-04 NOTE — Progress Notes (Signed)
Daily Session Note  Patient Details  Name: Brandi Roach MRN: [id number] Date of Birth: [birth year] Referring Provider:   Flowsheet Row Cardiac Rehab from 02/24/2023 in [facility] Cardiac and Pulmonary Rehab  Referring Provider Dr. Youlanda Mighty       Encounter Date: 06/04/2023  Check In:  Session Check In - 06/04/23 0758       Check-In   Supervising physician immediately available to respond to emergencies See telemetry face sheet for immediately available ER MD    Location [facility]-Cardiac & Pulmonary Rehab    Staff Present Elige Ko RCP,RRT,BSRT;Noah Tickle, BS, Exercise Physiologist;Alleya Demeter, RN, BSN, CCRP    Virtual Visit No    Medication changes reported     No    Fall or balance concerns reported    No    Warm-up and Cool-down Performed on first and last piece of equipment    Resistance Training Performed Yes    VAD Patient? No    PAD/SET Patient? No      Pain Assessment   Currently in Pain? No/denies                Social History   Tobacco Use  Smoking Status Never  Smokeless Tobacco Never    Goals Met:  Independence with exercise equipment Exercise tolerated well No report of concerns or symptoms today  Goals Unmet:  Not Applicable  Comments: Pt able to follow exercise prescription today without complaint.  Will continue to monitor for progression.    Dr. Bethann Punches is Medical Director for [facility] Cardiac Rehabilitation.  Dr. Vida Rigger is Medical Director for [facility] Pulmonary Rehabilitation.

## 2023-06-07 ENCOUNTER — Encounter: Payer: 59 | Admitting: *Deleted

## 2023-06-07 DIAGNOSIS — Z955 Presence of coronary angioplasty implant and graft: Secondary | ICD-10-CM

## 2023-06-07 DIAGNOSIS — Z48812 Encounter for surgical aftercare following surgery on the circulatory system: Secondary | ICD-10-CM | POA: Diagnosis not present

## 2023-06-07 NOTE — Progress Notes (Signed)
Daily Session Note  Patient Details  Name: Brandi Roach MRN: [id number] Date of Birth: 12-Nov-1955 Referring Provider:   Flowsheet Row Cardiac Rehab from 02/24/2023 in [facility] Cardiac and Pulmonary Rehab  Referring Provider Dr. Youlanda Mighty       Encounter Date: 06/07/2023  Check In:  Session Check In - 06/07/23 0756       Check-In   Supervising physician immediately available to respond to emergencies See telemetry face sheet for immediately available ER MD    Location [facility]-Cardiac & Pulmonary Rehab    Staff Present Elige Ko RCP,RRT,BSRT;Kelly Madilyn Fireman BS, ACSM CEP, Exercise Physiologist;Press Casale, RN, BSN, CCRP    Virtual Visit No    Medication changes reported     No    Fall or balance concerns reported    No    Warm-up and Cool-down Performed on first and last piece of equipment    Resistance Training Performed Yes    VAD Patient? No    PAD/SET Patient? No      Pain Assessment   Currently in Pain? No/denies                Social History   Tobacco Use  Smoking Status Never  Smokeless Tobacco Never    Goals Met:  Independence with exercise equipment Exercise tolerated well Personal goals reviewed No report of concerns or symptoms today  Goals Unmet:  Not Applicable  Comments: Pt able to follow exercise prescription today without complaint.  Will continue to monitor for progression.  Reviewed home exercise with pt today.  Pt plans to walk and use weights for exercise.  Reviewed THR, pulse, RPE, sign and symptoms, pulse oximetery and when to call 911 or MD.  Also discussed weather considerations and indoor options.  Pt voiced understanding.   Dr. Bethann Punches is Medical Director for [facility] Cardiac Rehabilitation.  Dr. Vida Rigger is Medical Director for [facility] Pulmonary Rehabilitation.

## 2023-06-09 DIAGNOSIS — Z48812 Encounter for surgical aftercare following surgery on the circulatory system: Secondary | ICD-10-CM | POA: Diagnosis not present

## 2023-06-09 DIAGNOSIS — Z955 Presence of coronary angioplasty implant and graft: Secondary | ICD-10-CM

## 2023-06-09 NOTE — Progress Notes (Signed)
Daily Session Note  Patient Details  Name: Brandi Roach MRN: [id number] Date of Birth: 09/01/1955 Referring Provider:   Flowsheet Row Cardiac Rehab from 02/24/2023 in [facility] Cardiac and Pulmonary Rehab  Referring Provider Dr. Youlanda Mighty       Encounter Date: 06/09/2023  Check In:  Session Check In - 06/09/23 0727       Check-In   Supervising physician immediately available to respond to emergencies See telemetry face sheet for immediately available ER MD    Location [facility]-Cardiac & Pulmonary Rehab    Staff Present Kelton Pillar RN,BSN,MPA;Joseph Hood RCP,RRT,BSRT;Maxon Manya Silvas BS, Exercise Physiologist;Margaret Best, MS, Exercise Physiologist    Virtual Visit No    Medication changes reported     No    Fall or balance concerns reported    No    Warm-up and Cool-down Performed on first and last piece of equipment    Resistance Training Performed Yes    VAD Patient? No    PAD/SET Patient? No      Pain Assessment   Currently in Pain? No/denies                Social History   Tobacco Use  Smoking Status Never  Smokeless Tobacco Never    Goals Met:  Independence with exercise equipment Exercise tolerated well No report of concerns or symptoms today Strength training completed today  Goals Unmet:  Not Applicable  Comments: Pt able to follow exercise prescription today without complaint.  Will continue to monitor for progression.    Dr. Bethann Punches is Medical Director for [facility] Cardiac Rehabilitation.  Dr. Vida Rigger is Medical Director for [facility] Pulmonary Rehabilitation.

## 2023-06-11 ENCOUNTER — Encounter: Payer: 59 | Admitting: *Deleted

## 2023-06-11 DIAGNOSIS — Z48812 Encounter for surgical aftercare following surgery on the circulatory system: Secondary | ICD-10-CM | POA: Diagnosis not present

## 2023-06-11 DIAGNOSIS — Z955 Presence of coronary angioplasty implant and graft: Secondary | ICD-10-CM

## 2023-06-11 NOTE — Progress Notes (Signed)
Daily Session Note  Patient Details  Name: Brandi Roach MRN: [id number] Date of Birth: May 20, 1955 Referring Provider:   Flowsheet Row Cardiac Rehab from 02/24/2023 in [facility] Cardiac and Pulmonary Rehab  Referring Provider Dr. Youlanda Mighty       Encounter Date: 06/11/2023  Check In:  Session Check In - 06/11/23 0753       Check-In   Supervising physician immediately available to respond to emergencies See telemetry face sheet for immediately available ER MD    Location [facility]-Cardiac & Pulmonary Rehab    Staff Present Elige Ko RCP,RRT,BSRT;Cora Collum, RN, BSN, CCRP;Noah Tickle, BS, Exercise Physiologist    Virtual Visit No    Medication changes reported     No    Fall or balance concerns reported    No    Warm-up and Cool-down Performed on first and last piece of equipment    Resistance Training Performed Yes    VAD Patient? No    PAD/SET Patient? No      Pain Assessment   Currently in Pain? No/denies                Social History   Tobacco Use  Smoking Status Never  Smokeless Tobacco Never    Goals Met:  Independence with exercise equipment Exercise tolerated well No report of concerns or symptoms today  Goals Unmet:  Not Applicable  Comments: Pt able to follow exercise prescription today without complaint.  Will continue to monitor for progression.    Dr. Bethann Punches is Medical Director for [facility] Cardiac Rehabilitation.  Dr. Vida Rigger is Medical Director for [facility] Pulmonary Rehabilitation.

## 2023-06-14 ENCOUNTER — Encounter: Payer: 59 | Attending: Cardiovascular Disease

## 2023-06-14 DIAGNOSIS — Z955 Presence of coronary angioplasty implant and graft: Secondary | ICD-10-CM | POA: Diagnosis present

## 2023-06-14 DIAGNOSIS — Z48812 Encounter for surgical aftercare following surgery on the circulatory system: Secondary | ICD-10-CM | POA: Insufficient documentation

## 2023-06-14 NOTE — Progress Notes (Signed)
Daily Session Note  Patient Details  Name: Brandi Roach MRN: [id number] Date of Birth: 01-20-1956 Referring Provider:   Flowsheet Row Cardiac Rehab from 02/24/2023 in [facility] Cardiac and Pulmonary Rehab  Referring Provider Dr. Youlanda Mighty       Encounter Date: 06/14/2023  Check In:  Session Check In - 06/14/23 0733       Check-In   Supervising physician immediately available to respond to emergencies See telemetry face sheet for immediately available ER MD    Location [facility]-Cardiac & Pulmonary Rehab    Staff Present Kelton Pillar RN,BSN,MPA;Joseph Reino Kent RCP,RRT,BSRT;Jason Wallace Cullens RDN,LDN;Dail Meece Madilyn Fireman BS, ACSM CEP, Exercise Physiologist    Virtual Visit No    Medication changes reported     No    Fall or balance concerns reported    No    Warm-up and Cool-down Performed on first and last piece of equipment    Resistance Training Performed Yes    VAD Patient? No    PAD/SET Patient? No      Pain Assessment   Currently in Pain? No/denies                Social History   Tobacco Use  Smoking Status Never  Smokeless Tobacco Never    Goals Met:  Independence with exercise equipment Exercise tolerated well No report of concerns or symptoms today Strength training completed today  Goals Unmet:  Not Applicable  Comments: Pt able to follow exercise prescription today without complaint.  Will continue to monitor for progression.    Dr. Bethann Punches is Medical Director for [facility] Cardiac Rehabilitation.  Dr. Vida Rigger is Medical Director for [facility] Pulmonary Rehabilitation.

## 2023-06-16 ENCOUNTER — Encounter: Payer: 59 | Admitting: *Deleted

## 2023-06-16 DIAGNOSIS — Z955 Presence of coronary angioplasty implant and graft: Secondary | ICD-10-CM

## 2023-06-16 DIAGNOSIS — Z48812 Encounter for surgical aftercare following surgery on the circulatory system: Secondary | ICD-10-CM | POA: Diagnosis not present

## 2023-06-16 NOTE — Progress Notes (Signed)
 Daily Session Note  Patient Details  Name: Brandi Roach MRN: [id number] Date of Birth: [birth year] Referring Provider:   Flowsheet Row Cardiac Rehab from 02/24/2023 in [facility] Cardiac and Pulmonary Rehab  Referring Provider Dr. Ozell Hays       Encounter Date: 06/16/2023  Check In:  Session Check In - 06/16/23 0753       Check-In   Supervising physician immediately available to respond to emergencies See telemetry face sheet for immediately available ER MD    Location [facility]-Cardiac & Pulmonary Rehab    Staff Present Maxon Conetta BS, Exercise Physiologist;Joseph Rolinda NORWOOD HARMAN SAMMIE;Othel Durand, RN, BSN, CCRP    Virtual Visit No    Medication changes reported     No    Fall or balance concerns reported    No    Warm-up and Cool-down Performed on first and last piece of equipment    Resistance Training Performed Yes    VAD Patient? No    PAD/SET Patient? No      Pain Assessment   Currently in Pain? No/denies                Social History   Tobacco Use  Smoking Status Never  Smokeless Tobacco Never    Goals Met:  Independence with exercise equipment Exercise tolerated well No report of concerns or symptoms today  Goals Unmet:  Not Applicable  Comments: Pt able to follow exercise prescription today without complaint.  Will continue to monitor for progression.    Dr. Oneil Pinal is Medical Director for [facility] Cardiac Rehabilitation.  Dr. Fuad Aleskerov is Medical Director for [facility] Pulmonary Rehabilitation.

## 2023-06-18 ENCOUNTER — Encounter: Payer: 59 | Admitting: *Deleted

## 2023-06-18 VITALS — Ht 64.5 in | Wt 123.9 lb

## 2023-06-18 DIAGNOSIS — Z955 Presence of coronary angioplasty implant and graft: Secondary | ICD-10-CM

## 2023-06-18 DIAGNOSIS — Z48812 Encounter for surgical aftercare following surgery on the circulatory system: Secondary | ICD-10-CM | POA: Diagnosis not present

## 2023-06-18 NOTE — Patient Instructions (Signed)
 Discharge Patient Instructions  Patient Details  Name: Brandi Roach MRN: [id number] Date of Birth: 1955-06-17 Referring Provider:  [facility]   Number of Visits: 34  Reason for Discharge:  Patient reached a stable level of exercise. Patient independent in their exercise. Patient has met program and personal goals.  Diagnosis:  Status post coronary artery stent placement  Initial Exercise Prescription:  Initial Exercise Prescription - 02/24/23 1100       Date of Initial Exercise RX and Referring Provider   Date 02/24/23    Referring Provider Dr. Ozell Hays      Oxygen   Maintain Oxygen Saturation 88% or higher      Treadmill   MPH 3.5    Grade 1    Minutes 15    METs 4.16      Elliptical   Level 1    Speed 3    Minutes 15    METs 4.14      REL-XR   Level 3    Speed 50    Minutes 15    METs 4.14      Prescription Details   Frequency (times per week) 3    Duration Progress to 30 minutes of continuous aerobic without signs/symptoms of physical distress      Intensity   THRR 40-80% of Max Heartrate 97-134    Ratings of Perceived Exertion 11-13    Perceived Dyspnea 0-4      Progression   Progression Continue to progress workloads to maintain intensity without signs/symptoms of physical distress.      Resistance Training   Training Prescription Yes    Weight 7    Reps 10-15             Discharge Exercise Prescription (Final Exercise Prescription Changes):  Exercise Prescription Changes - 06/17/23 0700       Response to Exercise   Blood Pressure (Admit) 104/66    Blood Pressure (Exit) 102/58    Heart Rate (Admit) 76 bpm    Heart Rate (Exercise) 57 bpm    Heart Rate (Exit) 92 bpm    Rating of Perceived Exertion (Exercise) 13    Symptoms none    Duration Continue with 30 min of aerobic exercise without signs/symptoms of physical distress.    Intensity THRR unchanged      Progression   Progression Continue to progress workloads to  maintain intensity without signs/symptoms of physical distress.    Average METs 7.34      Resistance Training   Training Prescription Yes    Weight 7 lb    Reps 10-15      Interval Training   Interval Training No      Treadmill   MPH 9    Grade 0    Minutes 15    METs 7.89      Elliptical   Level 9    Speed 3.8    Minutes 15    METs 7.1      REL-XR   Level 9    Minutes 15      Home Exercise Plan   Plans to continue exercise at Home (comment)   walking and weights   Frequency Add 2 additional days to program exercise sessions.    Initial Home Exercises Provided 06/07/23      Oxygen   Maintain Oxygen Saturation 88% or higher             Functional Capacity:  6 Minute Walk  Row Name 02/24/23 1140 06/18/23 0743       6 Minute Walk   Phase Initial Discharge    Distance 1660 feet 1810 feet    Distance % Change -- 9.03 %    Distance Feet Change -- 150 ft    Walk Time 6 minutes 6 minutes    # of Rest Breaks 0 0    MPH 3.14 3.43    METS 4.14 4.43    RPE 12 7    Perceived Dyspnea  0 0    VO2 Peak 14.5 15.51    Symptoms No Yes (comment)    Comments -- congested    Resting HR 61 bpm 78 bpm    Resting BP 140/82 116/58    Resting Oxygen Saturation  98 % 96 %    Exercise Oxygen Saturation  during 6 min walk 95 % 89 %    Max Ex. HR 102 bpm 112 bpm    Max Ex. BP 148/82 138/64    2 Minute Post BP 124/72 --            Nutrition & Weight - Outcomes:  Pre Biometrics - 02/24/23 1156       Pre Biometrics   Height 5' 4.5 (1.638 m)    Weight 124 lb (56.2 kg)    Waist Circumference 30.5 inches    Hip Circumference 37 inches    Waist to Hip Ratio 0.82 %    BMI (Calculated) 20.96    Single Leg Stand 30 seconds             Post Biometrics - 06/18/23 0745        Post  Biometrics   Height 5' 4.5 (1.638 m)    Weight 123 lb 14.4 oz (56.2 kg)    Waist Circumference 31 inches    Hip Circumference 34 inches    Waist to Hip Ratio 0.91 %    BMI  (Calculated) 20.95    Single Leg Stand 30 seconds             Nutrition:  Nutrition Therapy & Goals - 02/24/23 1507       Nutrition Therapy   Diet cardiac, Low na    Protein (specify units) 75    Fiber 25 grams    Whole Grain Foods 3 servings    Saturated Fats 15 max. grams    Fruits and Vegetables 5 servings/day    Sodium 2 grams      Personal Nutrition Goals   Nutrition Goal Drink 64oz of water    Personal Goal #2 Continue to build balanced plates and meet nutrition goals    Personal Goal #3 When snacking ask why.    Comments Patient drinking 45oz of water, set goal to aim for ~64oz daily. She is vegetarian and has structured eating patterns. She eats lots of fruits and veggies, includes good sources of plant based protein as well as some dairy and eggs in moderation. Reviewed Mediterranean diet handout, types of fats, sources, and how to read labels. Went over a few fact labels as well, educated on ways to keep sodium intake less than 2300mg . Built out meals and snacks focused on meeting needs with vegetarian friendly foods that she likes and will eat.      Intervention Plan   Intervention Prescribe, educate and counsel regarding individualized specific dietary modifications aiming towards targeted core components such as weight, hypertension, lipid management, diabetes, heart failure and other comorbidities.;Nutrition handout(s) given to patient.  Expected Outcomes Short Term Goal: Understand basic principles of dietary content, such as calories, fat, sodium, cholesterol and nutrients.;Short Term Goal: A plan has been developed with personal nutrition goals set during dietitian appointment.;Long Term Goal: Adherence to prescribed nutrition plan.

## 2023-06-18 NOTE — Progress Notes (Signed)
 Daily Session Note  Patient Details  Name: Brandi Roach MRN: [id number] Date of Birth: 1955/09/11 Referring Provider:   Flowsheet Row Cardiac Rehab from 02/24/2023 in [facility] Cardiac and Pulmonary Rehab  Referring Provider Dr. Ozell Hays       Encounter Date: 06/18/2023  Check In:  Session Check In - 06/18/23 0759       Check-In   Supervising physician immediately available to respond to emergencies See telemetry face sheet for immediately available ER MD    Location [facility]-Cardiac & Pulmonary Rehab    Staff Present Fairy Plater RCP,RRT,BSRT;Othel Durand, RN, BSN, CCRP;Noah Tickle, BS, Exercise Physiologist    Virtual Visit No    Medication changes reported     No    Fall or balance concerns reported    No    Warm-up and Cool-down Performed on first and last piece of equipment    Resistance Training Performed Yes    VAD Patient? No    PAD/SET Patient? No      Pain Assessment   Currently in Pain? No/denies                Social History   Tobacco Use  Smoking Status Never  Smokeless Tobacco Never    Goals Met:  Independence with exercise equipment Exercise tolerated well No report of concerns or symptoms today  Goals Unmet:  Not Applicable  Comments: Pt able to follow exercise prescription today without complaint.  Will continue to monitor for progression.    Dr. Oneil Pinal is Medical Director for [facility] Cardiac Rehabilitation.  Dr. Fuad Aleskerov is Medical Director for [facility] Pulmonary Rehabilitation.

## 2023-06-21 ENCOUNTER — Telehealth: Payer: Self-pay | Admitting: *Deleted

## 2023-06-21 NOTE — Telephone Encounter (Signed)
 Brandi Roach called to report that she has the flu and will not be in class today or Wednesday. She will call to update later in the week and let us  know if she will be able to make it to her Friday class, but she is thinking she will probably not be here on Friday as well.

## 2023-06-23 DIAGNOSIS — Z955 Presence of coronary angioplasty implant and graft: Secondary | ICD-10-CM

## 2023-06-23 NOTE — Progress Notes (Signed)
Cardiac Individual Treatment Plan  Patient Details  Name: Brandi Roach MRN: [id number] Date of Birth: 01/16/1956 Referring Provider:   Flowsheet Row Cardiac Rehab from 02/24/2023 in [facility] Cardiac and Pulmonary Rehab  Referring Provider Dr. Youlanda Mighty       Initial Encounter Date:  Flowsheet Row Cardiac Rehab from 02/24/2023 in [facility] Cardiac and Pulmonary Rehab  Date 02/24/23       Visit Diagnosis: Status post coronary artery stent placement  Patient's Home Medications on Admission:  Current Outpatient Medications:    aspirin EC 81 MG tablet, Take 1 tablet by mouth daily., Disp: , Rfl:    atorvastatin (LIPITOR) 80 MG tablet, Take 80 mg by mouth daily., Disp: , Rfl:    Cholecalciferol (VITAMIN D) 50 MCG (2000 UT) tablet, Take 2,000 Units by mouth daily., Disp: , Rfl:    Cholecalciferol 10 MCG (400 UNIT) CAPS, Take 400 Units by mouth daily., Disp: , Rfl:    clopidogrel (PLAVIX) 75 MG tablet, Take 75 mg by mouth daily., Disp: , Rfl:    cyclobenzaprine (FLEXERIL) 5 MG tablet, Take 1 tablet (5 mg total) by mouth 3 (three) times daily as needed for muscle spasms. (Patient not taking: Reported on 06/30/2021), Disp: 30 tablet, Rfl: 1   denosumab (PROLIA) 60 MG/ML SOSY injection, Inject 60 mg into the skin once., Disp: , Rfl:    estradiol (ESTRACE) 0.1 MG/GM vaginal cream, PLACE 1 GRAM VAGINALLY 2 TIMES A WEEK, Disp: , Rfl:    ezetimibe (ZETIA) 10 MG tablet, Take 1 tablet by mouth daily., Disp: , Rfl:    fluorouracil (EFUDEX) 5 % cream, Apply topically., Disp: , Rfl:    lisinopril (ZESTRIL) 5 MG tablet, Take by mouth. (Patient not taking: Reported on 02/18/2023), Disp: , Rfl:    losartan (COZAAR) 100 MG tablet, Take 100 mg by mouth daily., Disp: , Rfl:    meloxicam (MOBIC) 7.5 MG tablet, Take 1 tablet (7.5 mg total) by mouth daily. (Patient not taking: Reported on 06/30/2021), Disp: 10 tablet, Rfl: 0  Past Medical History: No past medical history on file.  Tobacco Use: Social History    Tobacco Use  Smoking Status Never  Smokeless Tobacco Never    Labs: Review Flowsheet       Latest Ref Rng & Units 09/15/2019 06/23/2021 07/13/2022  Labs for ITP Cardiac and Pulmonary Rehab  Cholestrol 100 - 199 mg/dL 782  956  213   LDL (calc) 0 - 99 mg/dL 69  85  68   HDL-C >08 mg/dL 85  95  84   Trlycerides 0 - 149 mg/dL 69  70  65      Exercise Target Goals: Exercise Program Goal: Individual exercise prescription set using results from initial 6 min walk test and THRR while considering  patient's activity barriers and safety.   Exercise Prescription Goal: Initial exercise prescription builds to 30-45 minutes a day of aerobic activity, 2-3 days per week.  Home exercise guidelines will be given to patient during program as part of exercise prescription that the participant will acknowledge.   Education: Aerobic Exercise: - Group verbal and visual presentation on the components of exercise prescription. Introduces F.I.T.T principle from ACSM for exercise prescriptions.  Reviews F.I.T.T. principles of aerobic exercise including progression. Written material given at graduation.   Education: Resistance Exercise: - Group verbal and visual presentation on the components of exercise prescription. Introduces F.I.T.T principle from ACSM for exercise prescriptions  Reviews F.I.T.T. principles of resistance exercise including progression. Written material given at  graduation. Flowsheet Row Cardiac Rehab from 03/31/2023 in [facility] Cardiac and Pulmonary Rehab  Date 03/03/23  Educator NT  Instruction Review Code 1- Verbalizes Understanding        Education: Exercise & Equipment Safety: - Individual verbal instruction and demonstration of equipment use and safety with use of the equipment. Flowsheet Row Cardiac Rehab from 03/31/2023 in [facility] Cardiac and Pulmonary Rehab  Date 02/24/23  Educator [facility]  Instruction Review Code 1- Verbalizes Understanding       Education: Exercise Physiology  & General Exercise Guidelines: - Group verbal and written instruction with models to review the exercise physiology of the cardiovascular system and associated critical values. Provides general exercise guidelines with specific guidelines to those with heart or lung disease.  Flowsheet Row Cardiac Rehab from 03/31/2023 in [facility] Cardiac and Pulmonary Rehab  Education need identified 02/24/23       Education: Flexibility, Balance, Mind/Body Relaxation: - Group verbal and visual presentation with interactive activity on the components of exercise prescription. Introduces F.I.T.T principle from ACSM for exercise prescriptions. Reviews F.I.T.T. principles of flexibility and balance exercise training including progression. Also discusses the mind body connection.  Reviews various relaxation techniques to help reduce and manage stress (i.e. Deep breathing, progressive muscle relaxation, and visualization). Balance handout provided to take home. Written material given at graduation. Flowsheet Row Cardiac Rehab from 03/31/2023 in [facility] Cardiac and Pulmonary Rehab  Date 03/03/23  Educator NT  Instruction Review Code 1- Verbalizes Understanding       Activity Barriers & Risk Stratification:  Activity Barriers & Cardiac Risk Stratification - 02/18/23 1432       Activity Barriers & Cardiac Risk Stratification   Activity Barriers None    Cardiac Risk Stratification Moderate             6 Minute Walk:  6 Minute Walk     Row Name 02/24/23 1140 06/18/23 0743       6 Minute Walk   Phase Initial Discharge    Distance 1660 feet 1810 feet    Distance % Change -- 9.03 %    Distance Feet Change -- 150 ft    Walk Time 6 minutes 6 minutes    # of Rest Breaks 0 0    MPH 3.14 3.43    METS 4.14 4.43    RPE 12 7    Perceived Dyspnea  0 0    VO2 Peak 14.5 15.51    Symptoms No Yes (comment)    Comments -- congested    Resting HR 61 bpm 78 bpm    Resting BP 140/82 116/58    Resting Oxygen  Saturation  98 % 96 %    Exercise Oxygen Saturation  during 6 min walk 95 % 89 %    Max Ex. HR 102 bpm 112 bpm    Max Ex. BP 148/82 138/64    2 Minute Post BP 124/72 --             Oxygen Initial Assessment:   Oxygen Re-Evaluation:   Oxygen Discharge (Final Oxygen Re-Evaluation):   Initial Exercise Prescription:  Initial Exercise Prescription - 02/24/23 1100       Date of Initial Exercise RX and Referring Provider   Date 02/24/23    Referring Provider Dr. Youlanda Mighty      Oxygen   Maintain Oxygen Saturation 88% or higher      Treadmill   MPH 3.5    Grade 1    Minutes 15  METs 4.16      Elliptical   Level 1    Speed 3    Minutes 15    METs 4.14      REL-XR   Level 3    Speed 50    Minutes 15    METs 4.14      Prescription Details   Frequency (times per week) 3    Duration Progress to 30 minutes of continuous aerobic without signs/symptoms of physical distress      Intensity   THRR 40-80% of Max Heartrate 97-134    Ratings of Perceived Exertion 11-13    Perceived Dyspnea 0-4      Progression   Progression Continue to progress workloads to maintain intensity without signs/symptoms of physical distress.      Resistance Training   Training Prescription Yes    Weight 7    Reps 10-15             Perform Capillary Blood Glucose checks as needed.  Exercise Prescription Changes:   Exercise Prescription Changes     Row Name 02/24/23 1100 03/10/23 0900 03/25/23 1400 03/29/23 0800 04/05/23 0800     Response to Exercise   Blood Pressure (Admit) 140/82 108/66 110/66 -- 132/68   Blood Pressure (Exercise) 148/82 144/62 144/60 -- 172/90   Blood Pressure (Exit) 124/72 106/68 102/60 -- 114/60   Heart Rate (Admit) 61 bpm 65 bpm 70 bpm -- 85 bpm   Heart Rate (Exercise) 102 bpm 150 bpm 140 bpm -- 164 bpm   Heart Rate (Exit) 70 bpm 71 bpm 95 bpm -- 116 bpm   Oxygen Saturation (Admit) 98 % -- -- -- --   Oxygen Saturation (Exercise) 95 % -- -- --  --   Oxygen Saturation (Exit) 98 % -- -- -- --   Rating of Perceived Exertion (Exercise) 12 13 13  -- 14   Perceived Dyspnea (Exercise) 0 -- 0 -- 0   Symptoms none none none -- none   Comments 6 MWT results First two days of exercise -- -- --   Duration -- Progress to 30 minutes of  aerobic without signs/symptoms of physical distress Progress to 30 minutes of  aerobic without signs/symptoms of physical distress Progress to 30 minutes of  aerobic without signs/symptoms of physical distress Progress to 30 minutes of  aerobic without signs/symptoms of physical distress   Intensity -- THRR unchanged THRR unchanged THRR New  100-145 THRR unchanged     Progression   Progression -- Continue to progress workloads to maintain intensity without signs/symptoms of physical distress. Continue to progress workloads to maintain intensity without signs/symptoms of physical distress. Continue to progress workloads to maintain intensity without signs/symptoms of physical distress. Continue to progress workloads to maintain intensity without signs/symptoms of physical distress.   Average METs -- 4.04 5.22 5.22 5.22     Resistance Training   Training Prescription -- Yes Yes Yes Yes   Weight -- 7 lb 7 lb 7 lb 7 lb   Reps -- 10-15 10-15 10-15 10-15     Interval Training   Interval Training -- No No No No     Treadmill   MPH -- 3.5 3.7 3.7 4.7   Grade -- 1 1 1  3.5   Minutes -- 15 15 15 15    METs -- 4.16 4.34 4.34 6.87     Elliptical   Level -- 2 6 6 7    Speed -- 3 3 3 4    Minutes -- 15 15  15 15   METs -- -- 6.1 6.1 7.6     REL-XR   Level -- 9 -- -- 10   Minutes -- 15 -- -- 15   METs -- -- -- -- 5.1     Oxygen   Maintain Oxygen Saturation -- 88% or higher 88% or higher 88% or higher 88% or higher    Row Name 04/22/23 0800 05/04/23 1000 05/18/23 1500 06/02/23 1100 06/07/23 0800     Response to Exercise   Blood Pressure (Admit) 110/64 102/64 110/66 114/56 --   Blood Pressure (Exercise) 120/82 -- --  -- --   Blood Pressure (Exit) 98/58 120/64 110/70 102/52 --   Heart Rate (Admit) 85 bpm 83 bpm 71 bpm 98 bpm --   Heart Rate (Exercise) 150 bpm 158 bpm 146 bpm 162 bpm --   Heart Rate (Exit) 99 bpm 99 bpm 109 bpm 101 bpm --   Rating of Perceived Exertion (Exercise) 13 13 13 13  --   Symptoms none none none none --   Duration Continue with 30 min of aerobic exercise without signs/symptoms of physical distress. Continue with 30 min of aerobic exercise without signs/symptoms of physical distress. Continue with 30 min of aerobic exercise without signs/symptoms of physical distress. Continue with 30 min of aerobic exercise without signs/symptoms of physical distress. Continue with 30 min of aerobic exercise without signs/symptoms of physical distress.   Intensity THRR unchanged THRR unchanged THRR unchanged THRR unchanged THRR unchanged     Progression   Progression Continue to progress workloads to maintain intensity without signs/symptoms of physical distress. Continue to progress workloads to maintain intensity without signs/symptoms of physical distress. Continue to progress workloads to maintain intensity without signs/symptoms of physical distress. Continue to progress workloads to maintain intensity without signs/symptoms of physical distress. Continue to progress workloads to maintain intensity without signs/symptoms of physical distress.   Average METs 6.26 6.08 4.78 6.58 6.58     Resistance Training   Training Prescription Yes Yes Yes Yes Yes   Weight 7 lb 7 lb 7 lb 7 lb 7 lb   Reps 10-15 10-15 10-15 10-15 10-15     Interval Training   Interval Training No Yes No Yes Yes   Equipment -- Treadmill -- Treadmill Treadmill   Comments -- every 2 min increasing speed to and incline to 2% -- running intervals of speed 5.5-6 mph running intervals of speed 5.5-6 mph     Treadmill   MPH 4 4 6 6 6    Grade 5 14 2 2 2    Minutes 15 15 15 15 15    METs 6.82 11.78 7.25 7.25 7.25     Elliptical    Level 8 10 -- 9 9   Speed 4 3.8 -- 3.8 3.8   Minutes 15 15 -- 15 15   METs -- 5.5 -- 7.25 7.25     REL-XR   Level 10 9 10 9 9    Minutes 15 15 15 15 15    METs -- 5.2 2.3 5.6 5.6     Home Exercise Plan   Plans to continue exercise at -- -- -- -- Home (comment)  walking and weights   Frequency -- -- -- -- Add 2 additional days to program exercise sessions.   Initial Home Exercises Provided -- -- -- -- 06/07/23     Oxygen   Maintain Oxygen Saturation 88% or higher 88% or higher 88% or higher 88% or higher 88% or higher    Row  Name 06/17/23 0700             Response to Exercise   Blood Pressure (Admit) 104/66       Blood Pressure (Exit) 102/58       Heart Rate (Admit) 76 bpm       Heart Rate (Exercise) 57 bpm       Heart Rate (Exit) 92 bpm       Rating of Perceived Exertion (Exercise) 13       Symptoms none       Duration Continue with 30 min of aerobic exercise without signs/symptoms of physical distress.       Intensity THRR unchanged         Progression   Progression Continue to progress workloads to maintain intensity without signs/symptoms of physical distress.       Average METs 7.34         Resistance Training   Training Prescription Yes       Weight 7 lb       Reps 10-15         Interval Training   Interval Training No         Treadmill   MPH 9       Grade 0       Minutes 15       METs 7.89         Elliptical   Level 9       Speed 3.8       Minutes 15       METs 7.1         REL-XR   Level 9       Minutes 15         Home Exercise Plan   Plans to continue exercise at Home (comment)  walking and weights       Frequency Add 2 additional days to program exercise sessions.       Initial Home Exercises Provided 06/07/23         Oxygen   Maintain Oxygen Saturation 88% or higher                Exercise Comments:   Exercise Comments     Row Name 03/01/23 1610 03/29/23 0900         Exercise Comments First full day of exercise!  Patient  was oriented to gym and equipment including functions, settings, policies, and procedures.  Patient's individual exercise prescription and treatment plan were reviewed.  All starting workloads were established based on the results of the 6 minute walk test done at initial orientation visit.  The plan for exercise progression was also introduced and progression will be customized based on patient's performance and goals. THRR was recalculate for Brandi Roach due to her current work loads and response to exercise. New THRR is 100-145 bpm.               Exercise Goals and Review:   Exercise Goals     Row Name 02/24/23 1154             Exercise Goals   Increase Physical Activity Yes       Intervention Provide advice, education, support and counseling about physical activity/exercise needs.;Develop an individualized exercise prescription for aerobic and resistive training based on initial evaluation findings, risk stratification, comorbidities and participant's personal goals.       Expected Outcomes Short Term: Attend rehab on a regular basis to increase amount of physical activity.;Long Term:  Add in home exercise to make exercise part of routine and to increase amount of physical activity.;Long Term: Exercising regularly at least 3-5 days a week.       Increase Strength and Stamina Yes       Intervention Provide advice, education, support and counseling about physical activity/exercise needs.;Develop an individualized exercise prescription for aerobic and resistive training based on initial evaluation findings, risk stratification, comorbidities and participant's personal goals.       Expected Outcomes Short Term: Increase workloads from initial exercise prescription for resistance, speed, and METs.;Short Term: Perform resistance training exercises routinely during rehab and add in resistance training at home;Long Term: Improve cardiorespiratory fitness, muscular endurance and strength as measured by  increased METs and functional capacity ( )       Able to understand and use rate of perceived exertion (RPE) scale Yes       Intervention Provide education and explanation on how to use RPE scale       Expected Outcomes Short Term: Able to use RPE daily in rehab to express subjective intensity level;Long Term:  Able to use RPE to guide intensity level when exercising independently       Able to understand and use Dyspnea scale Yes       Intervention Provide education and explanation on how to use Dyspnea scale       Expected Outcomes Short Term: Able to use Dyspnea scale daily in rehab to express subjective sense of shortness of breath during exertion;Long Term: Able to use Dyspnea scale to guide intensity level when exercising independently       Knowledge and understanding of Target Heart Rate Range (THRR) Yes       Intervention Provide education and explanation of THRR including how the numbers were predicted and where they are located for reference       Expected Outcomes Short Term: Able to state/look up THRR;Long Term: Able to use THRR to govern intensity when exercising independently;Short Term: Able to use daily as guideline for intensity in rehab       Able to check pulse independently Yes       Intervention Provide education and demonstration on how to check pulse in carotid and radial arteries.;Review the importance of being able to check your own pulse for safety during independent exercise       Expected Outcomes Short Term: Able to explain why pulse checking is important during independent exercise;Long Term: Able to check pulse independently and accurately       Understanding of Exercise Prescription Yes       Intervention Provide education, explanation, and written materials on patient's individual exercise prescription       Expected Outcomes Short Term: Able to explain program exercise prescription;Long Term: Able to explain home exercise prescription to exercise independently                 Exercise Goals Re-Evaluation :  Exercise Goals Re-Evaluation     Row Name 03/01/23 [id number] 03/10/23 0952 03/25/23 1459 03/29/23 0900 04/05/23 0841     Exercise Goal Re-Evaluation   Exercise Goals Review Able to understand and use rate of perceived exertion (RPE) scale;Knowledge and understanding of Target Heart Rate Range (THRR);Able to understand and use Dyspnea scale;Understanding of Exercise Prescription Increase Physical Activity;Increase Strength and Stamina;Understanding of Exercise Prescription Increase Physical Activity;Increase Strength and Stamina;Understanding of Exercise Prescription -- Increase Physical Activity;Increase Strength and Stamina;Understanding of Exercise Prescription   Comments Reviewed RPE and dyspnea scale, THR and  program prescription with pt today.  Pt voiced understanding and was given a copy of goals to take home. Brandi Roach is off to a good start in the program. She did well on the treadmill during her first two sessions at a speed of 3.5 mph and an incline of 1%. She also worked on the elliptical at level 2 and the XR at level 9. We will continue to monitor her progress in the program. Brandi Roach continues to make improvements in the program. She has been able to increase her level on the elliptical from level 2 to 6. She has also increased her speed on the treadmill from 3. to 3.4mph. We will continue to monitor her progress in the program. THRR was recalculate for Brandi Roach due to her current work loads and response to exercise. New THRR is 100-145 bpm. Brandi Roach continues to make improvements in rehab. She has recently been able to increase her workload on the treadmill to a speed of 4.7 mph and an incline of 3.5%. She also has been able to increase her level on the XR from level 9 to level 10. We will continue to monitor her progress in the program.   Expected Outcomes Short: Use RPE daily to regulate intensity. Long: Follow program prescription in THR. Short: Continue to  follow current exercise prescription. Long: Continue exercise to improve strength and stamina. Short: Continue to follow current exercise prescription. Long: Continue exercise to improve strength and stamina. -- Short: Continue to follow current exercise prescription. Long: Continue exercise to improve strength and stamina.    Row Name 04/22/23 0846 05/04/23 1037 05/18/23 1506 06/02/23 1136 06/07/23 0802     Exercise Goal Re-Evaluation   Exercise Goals Review Increase Physical Activity;Increase Strength and Stamina;Understanding of Exercise Prescription Increase Physical Activity;Increase Strength and Stamina;Understanding of Exercise Prescription Increase Physical Activity;Increase Strength and Stamina;Understanding of Exercise Prescription Increase Physical Activity;Increase Strength and Stamina;Understanding of Exercise Prescription Increase Physical Activity;Able to understand and use rate of perceived exertion (RPE) scale;Knowledge and understanding of Target Heart Rate Range (THRR);Understanding of Exercise Prescription;Increase Strength and Stamina;Able to understand and use Dyspnea scale;Able to check pulse independently   Comments Brandi Roach is doing well in rehab. She recently increased her incline on the treadmill to 5% while maintaining a speed of 4 mph. She also improved to level 8 on the elliptical and continues to work at level 10 on the XR. We will continue to monitor her progress in the program. Brandi Roach continues to do well in rehab. She increased her incline on the treadmill to 14% with a speed of . She also tried intervals on the treadmill increasing every 2 minutes to a speed of with incline of 2%. She also increased to level 10 on the elliptical. We will continue to monitor her progress in the program. Brandi Roach has only attended one session since the last review. During that session she was able to work on the treadmill at a speed of 6 mph with a 2% incline and used the XR at level 10. We will  continue to monitor her progress in the program. Brandi Roach has only attended two sessions since the last review. She is doing well on the treadmill at running intervals ranging from 5.5-6 mph with a 2% incline. She also continues to work at level 9 on the XR and elliptcal. We will continue to monitor her progress in the program. Reviewed home exercise with pt today.  Pt plans to walk and use weights for exercise.  Reviewed THR, pulse,  RPE, sign and symptoms, pulse oximetery and when to call 911 or MD.  Also discussed weather considerations and indoor options.  Pt voiced understanding.   Expected Outcomes Short: Continue to progressively increase treadmill workload. Long: Continue exercise to improve strength and stamina. Short: Continue to progressively increase treadmill and elliptical workloads and trying interval training on the treadmill. Long: Continue exercise to improve strength and stamina. Short: Attend rehab more consistently. Long: Continue exercise to improve strength and stamina. Short: Attend rehab more consistently. Long: Continue exercise to improve strength and stamina. Shot: add 1-2 days a week of exercise at home on off days of rehab. Long: become independent with exercise routine.    Row Name 06/17/23 0739             Exercise Goal Re-Evaluation   Exercise Goals Review Increase Physical Activity;Increase Strength and Stamina;Understanding of Exercise Prescription       Comments Brandi Roach continues to do well in rehab. She is due for her post and will look to improve on it. She continues to work at level 9 on both the XR and elliptical. She also increased her speed on the treadmill to a speed of 9 mph with 0% incline when running, and a speed of 3.8 mph with 8.5% incline for walking. We will continue to monitor her progress in the program.       Expected Outcomes Short: Improve on post . Long: Continue exercise to improve strength and stamina.                Discharge Exercise  Prescription (Final Exercise Prescription Changes):  Exercise Prescription Changes - 06/17/23 0700       Response to Exercise   Blood Pressure (Admit) 104/66    Blood Pressure (Exit) 102/58    Heart Rate (Admit) 76 bpm    Heart Rate (Exercise) 57 bpm    Heart Rate (Exit) 92 bpm    Rating of Perceived Exertion (Exercise) 13    Symptoms none    Duration Continue with 30 min of aerobic exercise without signs/symptoms of physical distress.    Intensity THRR unchanged      Progression   Progression Continue to progress workloads to maintain intensity without signs/symptoms of physical distress.    Average METs 7.34      Resistance Training   Training Prescription Yes    Weight 7 lb    Reps 10-15      Interval Training   Interval Training No      Treadmill   MPH 9    Grade 0    Minutes 15    METs 7.89      Elliptical   Level 9    Speed 3.8    Minutes 15    METs 7.1      REL-XR   Level 9    Minutes 15      Home Exercise Plan   Plans to continue exercise at Home (comment)   walking and weights   Frequency Add 2 additional days to program exercise sessions.    Initial Home Exercises Provided 06/07/23      Oxygen   Maintain Oxygen Saturation 88% or higher             Nutrition:  Target Goals: Understanding of nutrition guidelines, daily intake of sodium 1500mg , cholesterol 200mg , calories 30% from fat and 7% or less from saturated fats, daily to have 5 or more servings of fruits and vegetables.  Education: All About  Nutrition: -Group instruction provided by verbal, written material, interactive activities, discussions, models, and posters to present general guidelines for heart healthy nutrition including fat, fiber, MyPlate, the role of sodium in heart healthy nutrition, utilization of the nutrition label, and utilization of this knowledge for meal planning. Follow up email sent as well. Written material given at graduation. Flowsheet Row Cardiac Rehab from  03/31/2023 in [facility] Cardiac and Pulmonary Rehab  Date 03/24/23  Educator JG  Instruction Review Code 1- Verbalizes Understanding       Biometrics:  Pre Biometrics - 02/24/23 1156       Pre Biometrics   Height 5' 4.5" (1.638 m)    Weight 124 lb (56.2 kg)    Waist Circumference 30.5 inches    Hip Circumference 37 inches    Waist to Hip Ratio 0.82 %    BMI (Calculated) 20.96    Single Leg Stand 30 seconds             Post Biometrics - 06/18/23 0745        Post  Biometrics   Height 5' 4.5" (1.638 m)    Weight 123 lb 14.4 oz (56.2 kg)    Waist Circumference 31 inches    Hip Circumference 34 inches    Waist to Hip Ratio 0.91 %    BMI (Calculated) 20.95    Single Leg Stand 30 seconds             Nutrition Therapy Plan and Nutrition Goals:  Nutrition Therapy & Goals - 02/24/23 1507       Nutrition Therapy   Diet cardiac, Low na    Protein (specify units) 75    Fiber 25 grams    Whole Grain Foods 3 servings    Saturated Fats 15 max. grams    Fruits and Vegetables 5 servings/day    Sodium 2 grams      Personal Nutrition Goals   Nutrition Goal Drink 64oz of water    Personal Goal #2 Continue to build balanced plates and meet nutrition goals    Personal Goal #3 When snacking ask why.    Comments Patient drinking 45oz of water, set goal to aim for ~64oz daily. She is vegetarian and has structured eating patterns. She eats lots of fruits and veggies, includes good sources of plant based protein as well as some dairy and eggs in moderation. Reviewed Mediterranean diet handout, types of fats, sources, and how to read labels. Went over a few fact labels as well, educated on ways to keep sodium intake less than 2300mg . Built out meals and snacks focused on meeting needs with vegetarian friendly foods that she likes and will eat.      Intervention Plan   Intervention Prescribe, educate and counsel regarding individualized specific dietary modifications aiming towards  targeted core components such as weight, hypertension, lipid management, diabetes, heart failure and other comorbidities.;Nutrition handout(s) given to patient.    Expected Outcomes Short Term Goal: Understand basic principles of dietary content, such as calories, fat, sodium, cholesterol and nutrients.;Short Term Goal: A plan has been developed with personal nutrition goals set during dietitian appointment.;Long Term Goal: Adherence to prescribed nutrition plan.             Nutrition Assessments:  MEDIFICTS Score Key: >=70 Need to make dietary changes  40-70 Heart Healthy Diet <= 40 Therapeutic Level Cholesterol Diet  Flowsheet Row Cardiac Rehab from 02/18/2023 in [facility] Cardiac and Pulmonary Rehab  Picture Your Plate Total Score on Admission  85      Picture Your Plate Scores: <16 Unhealthy dietary pattern with much room for improvement. 41-50 Dietary pattern unlikely to meet recommendations for good health and room for improvement. 51-60 More healthful dietary pattern, with some room for improvement.  >60 Healthy dietary pattern, although there may be some specific behaviors that could be improved.    Nutrition Goals Re-Evaluation:  Nutrition Goals Re-Evaluation     Row Name 03/31/23 0747 05/19/23 0731 06/09/23 0732         Goals   Comment Brandi Roach is using a system that helps her track how much water she is drinking. Her bottle has a measure on it to keep track of how much water she is eating. She has no other questions with her diet at this time. Brandi Roach is working on drinking her water more. She has not been as good as she wants to be with her intake. She is going to try to intake more water by using battles with markers on them. Brandi Roach feels as though she is managing her diet well and getting her required water intake.     Expected Outcome Short: continue to drink more water. Long: maintain a diet that adheres to her. Short: drink more water. Long: maintain water intake independently.  Short: Continue to manage diet and drink a consistent amount of water. Long: Maintain a heart healthy diet              Nutrition Goals Discharge (Final Nutrition Goals Re-Evaluation):  Nutrition Goals Re-Evaluation - 06/09/23 0732       Goals   Comment Brandi Roach feels as though she is managing her diet well and getting her required water intake.    Expected Outcome Short: Continue to manage diet and drink a consistent amount of water. Long: Maintain a heart healthy diet             Psychosocial: Target Goals: Acknowledge presence or absence of significant depression and/or stress, maximize coping skills, provide positive support system. Participant is able to verbalize types and ability to use techniques and skills needed for reducing stress and depression.   Education: Stress, Anxiety, and Depression - Group verbal and visual presentation to define topics covered.  Reviews how body is impacted by stress, anxiety, and depression.  Also discusses healthy ways to reduce stress and to treat/manage anxiety and depression.  Written material given at graduation.   Education: Sleep Hygiene -Provides group verbal and written instruction about how sleep can affect your health.  Define sleep hygiene, discuss sleep cycles and impact of sleep habits. Review good sleep hygiene tips.    Initial Review & Psychosocial Screening:  Initial Psych Review & Screening - 02/18/23 1437       Initial Review   Current issues with Current Stress Concerns    Source of Stress Concerns Financial      Family Dynamics   Good Support System? Yes      Screening Interventions   Interventions Encouraged to exercise;Provide feedback about the scores to participant;To provide support and resources with identified psychosocial needs    Expected Outcomes Long Term Goal: Stressors or current issues are controlled or eliminated.;Short Term goal: Utilizing psychosocial counselor, staff and physician to assist with  identification of specific Stressors or current issues interfering with healing process. Setting desired goal for each stressor or current issue identified.;Short Term goal: Identification and review with participant of any Quality of Life or Depression concerns found by scoring the questionnaire.;Long Term goal: The  participant improves quality of Life and PHQ9 Scores as seen by post scores and/or verbalization of changes             Quality of Life Scores:   Quality of Life - 02/18/23 1503       Quality of Life   Select Quality of Life      Quality of Life Scores   Health/Function Pre 24.5 %    Socioeconomic Pre 23.06 %    Psych/Spiritual Pre 22.5 %    Family Pre 27.6 %    GLOBAL Pre 24.21 %            Scores of 19 and below usually indicate a poorer quality of life in these areas.  A difference of  2-3 points is a clinically meaningful difference.  A difference of 2-3 points in the total score of the Quality of Life Index has been associated with significant improvement in overall quality of life, self-image, physical symptoms, and general health in studies assessing change in quality of life.  PHQ-9: Review Flowsheet       02/24/2023  Depression screen PHQ 2/9  Decreased Interest 0  Down, Depressed, Hopeless 0  PHQ - 2 Score 0  Altered sleeping 0  Tired, decreased energy 0  Change in appetite 0  Feeling bad or failure about yourself  0  Trouble concentrating 0  Moving slowly or fidgety/restless 0  Suicidal thoughts 0  PHQ-9 Score 0  Difficult doing work/chores Not difficult at all   Interpretation of Total Score  Total Score Depression Severity:  1-4 = Minimal depression, 5-9 = Mild depression, 10-14 = Moderate depression, 15-19 = Moderately severe depression, 20-27 = Severe depression   Psychosocial Evaluation and Intervention:  Psychosocial Evaluation - 02/18/23 1452       Psychosocial Evaluation & Interventions   Interventions Encouraged to exercise  with the program and follow exercise prescription;Stress management education;Relaxation education    Comments Brandi 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 She feels like she is recovering well. She is usually a very active person who enjoys walking and especially swimming. She was instructed not to return to those activities at her previous pace until she starts cardiac rehab, so she is very ready to start the program. When asked about her mental health, she states that there is some stress related to whether or not she should retire and the financial involvement. She has a great support system.    Expected Outcomes Short: attend cardiac rehab for education and exercise. Long: Develop and maintain positive self care habits    Continue Psychosocial Services  Follow up required by staff             Psychosocial Re-Evaluation:  Psychosocial Re-Evaluation     Row Name 03/31/23 0746 05/19/23 0731 06/09/23 0728         Psychosocial Re-Evaluation   Current issues with None Identified None Identified None Identified     Comments Patient reports no issues with their current mental states, sleep, stress, depression or anxiety. Will follow up with patient in a few weeks for any changes. Patient reports no issues with their current mental states, sleep, stress, depression or anxiety. Will follow up with patient in a few weeks for any changes. Brandi Roach states that she has no major stressors at this time, she does feel that the political climate is giving her some unease. Brandi Roach also states that she is having no sleeping issues. We will follow up with  patients in a few weeks for any changes.     Expected Outcomes Short: Continue to exercise regularly to support mental health and notify staff of any changes. Long: maintain mental health and well being through teaching of rehab or prescribed medications independently. Short: Continue to exercise regularly to support mental health and notify staff of any  changes. Long: maintain mental health and well being through teaching of rehab or prescribed medications independently. Short: Continue to exercise regularly to support mental health and notify staff of any changes. Long: maintain mental health and well being through teaching of rehab or prescribed medications independently.     Interventions Encouraged to attend Cardiac Rehabilitation for the exercise Encouraged to attend Cardiac Rehabilitation for the exercise Encouraged to attend Cardiac Rehabilitation for the exercise     Continue Psychosocial Services  Follow up required by staff Follow up required by staff Follow up required by staff              Psychosocial Discharge (Final Psychosocial Re-Evaluation):  Psychosocial Re-Evaluation - 06/09/23 0728       Psychosocial Re-Evaluation   Current issues with None Identified    Comments Brandi Roach states that she has no major stressors at this time, she does feel that the political climate is giving her some unease. Brandi Roach also states that she is having no sleeping issues. We will follow up with patients in a few weeks for any changes.    Expected Outcomes Short: Continue to exercise regularly to support mental health and notify staff of any changes. Long: maintain mental health and well being through teaching of rehab or prescribed medications independently.    Interventions Encouraged to attend Cardiac Rehabilitation for the exercise    Continue Psychosocial Services  Follow up required by staff             Vocational Rehabilitation: Provide vocational rehab assistance to qualifying candidates.   Vocational Rehab Evaluation & Intervention:  Vocational Rehab - 02/18/23 1437       Initial Vocational Rehab Evaluation & Intervention   Assessment shows need for Vocational Rehabilitation No             Education: Education Goals: Education classes will be provided on a variety of topics geared toward better understanding of heart health  and risk factor modification. Participant will state understanding/return demonstration of topics presented as noted by education test scores.  Learning Barriers/Preferences:  Learning Barriers/Preferences - 02/18/23 1437       Learning Barriers/Preferences   Learning Barriers None    Learning Preferences None             General Cardiac Education Topics:  AED/CPR: - Group verbal and written instruction with the use of models to demonstrate the basic use of the AED with the basic ABC's of resuscitation.   Anatomy and Cardiac Procedures: - Group verbal and visual presentation and models provide information about basic cardiac anatomy and function. Reviews the testing methods done to diagnose heart disease and the outcomes of the test results. Describes the treatment choices: Medical Management, Angioplasty, or Coronary Bypass Surgery for treating various heart conditions including Myocardial Infarction, Angina, Valve Disease, and Cardiac Arrhythmias.  Written material given at graduation. Flowsheet Row Cardiac Rehab from 03/31/2023 in [facility] Cardiac and Pulmonary Rehab  Date 03/31/23  Educator SB  Instruction Review Code 1- Verbalizes Understanding       Medication Safety: - Group verbal and visual instruction to review commonly prescribed medications for heart and lung disease.  Reviews the medication, class of the drug, and side effects. Includes the steps to properly store meds and maintain the prescription regimen.  Written material given at graduation.   Intimacy: - Group verbal instruction through game format to discuss how heart and lung disease can affect sexual intimacy. Written material given at graduation..   Know Your Numbers and Heart Failure: - Group verbal and visual instruction to discuss disease risk factors for cardiac and pulmonary disease and treatment options.  Reviews associated critical values for Overweight/Obesity, Hypertension, Cholesterol, and Diabetes.   Discusses basics of heart failure: signs/symptoms and treatments.  Introduces Heart Failure Zone chart for action plan for heart failure.  Written material given at graduation. Flowsheet Row Cardiac Rehab from 03/31/2023 in [facility] Cardiac and Pulmonary Rehab  Education need identified 02/24/23       Infection Prevention: - Provides verbal and written material to individual with discussion of infection control including proper hand washing and proper equipment cleaning during exercise session. Flowsheet Row Cardiac Rehab from 03/31/2023 in [facility] Cardiac and Pulmonary Rehab  Date 02/24/23  Educator [facility]  Instruction Review Code 1- Verbalizes Understanding       Falls Prevention: - Provides verbal and written material to individual with discussion of falls prevention and safety. Flowsheet Row Cardiac Rehab from 03/31/2023 in [facility] Cardiac and Pulmonary Rehab  Date 02/24/23  Educator [facility]  Instruction Review Code 1- Verbalizes Understanding       Other: -Provides group and verbal instruction on various topics (see comments)   Knowledge Questionnaire Score:  Knowledge Questionnaire Score - 02/18/23 1503       Knowledge Questionnaire Score   Pre Score 24/26             Core Components/Risk Factors/Patient Goals at Admission:  Personal Goals and Risk Factors at Admission - 02/24/23 1157       Core Components/Risk Factors/Patient Goals on Admission    Weight Management Yes    Intervention Weight Management: Develop a combined nutrition and exercise program designed to reach desired caloric intake, while maintaining appropriate intake of nutrient and fiber, sodium and fats, and appropriate energy expenditure required for the weight goal.;Weight Management: Provide education and appropriate resources to help participant work on and attain dietary goals.;Weight Management/Obesity: Establish reasonable short term and long term weight goals.    Admit Weight 124 lb (56.2 kg)    Goal  Weight: Short Term 124 lb (56.2 kg)    Goal Weight: Long Term 124 lb (56.2 kg)    Expected Outcomes Short Term: Continue to assess and modify interventions until short term weight is achieved;Long Term: Adherence to nutrition and physical activity/exercise program aimed toward attainment of established weight goal;Weight Maintenance: Understanding of the daily nutrition guidelines, which includes 25-35% calories from fat, 7% or less cal from saturated fats, less than 200mg  cholesterol, less than 1.5gm of sodium, & 5 or more servings of fruits and vegetables daily;Understanding recommendations for meals to include 15-35% energy as protein, 25-35% energy from fat, 35-60% energy from carbohydrates, less than 200mg  of dietary cholesterol, 20-35 gm of total fiber daily;Understanding of distribution of calorie intake throughout the day with the consumption of 4-5 meals/snacks    Hypertension Yes    Intervention Provide education on lifestyle modifcations including regular physical activity/exercise, weight management, moderate sodium restriction and increased consumption of fresh fruit, vegetables, and low fat dairy, alcohol moderation, and smoking cessation.;Monitor prescription use compliance.    Expected Outcomes Short Term: Continued assessment and intervention until BP is <  140/63mm HG in hypertensive participants. < 130/35mm HG in hypertensive participants with diabetes, heart failure or chronic kidney disease.;Long Term: Maintenance of blood pressure at goal levels.    Lipids Yes    Intervention Provide education and support for participant on nutrition & aerobic/resistive exercise along with prescribed medications to achieve LDL 70mg , HDL >40mg .    Expected Outcomes Short Term: Participant states understanding of desired cholesterol values and is compliant with medications prescribed. Participant is following exercise prescription and nutrition guidelines.;Long Term: Cholesterol controlled with  medications as prescribed, with individualized exercise RX and with personalized nutrition plan. Value goals: LDL < 70mg , HDL > 40 mg.             Education:Diabetes - Individual verbal and written instruction to review signs/symptoms of diabetes, desired ranges of glucose level fasting, after meals and with exercise. Acknowledge that pre and post exercise glucose checks will be done for 3 sessions at entry of program.   Core Components/Risk Factors/Patient Goals Review:   Goals and Risk Factor Review     Row Name 03/31/23 0749 05/19/23 0737 06/09/23 0735         Core Components/Risk Factors/Patient Goals Review   Personal Goals Review Weight Management/Obesity;Other Other Other     Review Brandi Roach is doing well in the program and is maintaining her weight. Her blood pressure is doing well and is usually never hypertensive. She has no questions on her medications and will continue to exercise. Montez states she has no questionsa about her health at this time and plans to do all 36 sessions of rehab. Brandi Roach states she has no questions about her health at this time and plans to do all 36 sessions of rehab.     Expected Outcomes Short: continue exercise. Long: maintain exercise post [employer]. 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              Core Components/Risk Factors/Patient Goals at Discharge (Final Review):   Goals and Risk Factor Review - 06/09/23 0735       Core Components/Risk Factors/Patient Goals Review   Personal Goals Review Other    Review Brandi Roach states she has no questions about her health at this time and plans to do all 36 sessions of rehab.    Expected Outcomes Short: Graduate [employer]. Long: maintain home exercise post Graduation.             ITP Comments:  ITP Comments     Row Name 02/18/23 1446 02/24/23 1139 03/01/23 0821 03/10/23 1517 03/31/23 1118    ITP Comments Initial phone call completed. Diagnosis can be found in [facility] 9/12. EP Orientation scheduled for Wednesday 10/16 at 10am. Completed and gym orientation. Initial ITP created and sent for review to Dr. Bethann Punches, Medical Director. First full day of exercise!  Patient was oriented to gym and equipment including functions, settings, policies, and procedures.  Patient's individual exercise prescription and treatment plan were reviewed.  All starting workloads were established based on the results of the 6 minute walk test done at initial orientation visit.  The plan for exercise progression was also introduced and progression will be customized based on patient's performance and goals. 30 Day review completed. Medical Director ITP review done, changes made as directed, and signed approval by Medical Director.     new to program 30 Day review completed. Medical Director ITP review done, changes made as directed, and signed approval  by Medical Director.    Row Name 04/28/23 1137 05/26/23 1504         ITP Comments 30 Day review completed. Medical Director ITP review done, changes made as directed, and signed approval by Medical Director. 30 Day review completed. Medical Director ITP review done, changes made as directed, and signed approval by Medical Director.               Comments: 30 day review

## 2023-06-25 DIAGNOSIS — Z955 Presence of coronary angioplasty implant and graft: Secondary | ICD-10-CM

## 2023-06-25 DIAGNOSIS — Z48812 Encounter for surgical aftercare following surgery on the circulatory system: Secondary | ICD-10-CM | POA: Diagnosis not present

## 2023-06-25 NOTE — Progress Notes (Signed)
Daily Session Note  Patient Details  Name: Brandi Roach MRN: [id number] Date of Birth: Jan 31, 1956 Referring Provider:   Flowsheet Row Cardiac Rehab from 02/24/2023 in [facility] Cardiac and Pulmonary Rehab  Referring Provider Dr. Youlanda Mighty       Encounter Date: 06/25/2023  Check In:  Session Check In - 06/25/23 0719       Check-In   Supervising physician immediately available to respond to emergencies See telemetry face sheet for immediately available ER MD    Location [facility]-Cardiac & Pulmonary Rehab    Staff Present Kelton Pillar RN,BSN,MPA;Noah Tickle, BS, Exercise Physiologist;Joseph Hollace Kinnier    Virtual Visit No    Medication changes reported     No    Fall or balance concerns reported    No    Warm-up and Cool-down Performed on first and last piece of equipment    Resistance Training Performed Yes    VAD Patient? No    PAD/SET Patient? No      Pain Assessment   Currently in Pain? No/denies                Social History   Tobacco Use  Smoking Status Never  Smokeless Tobacco Never    Goals Met:  Independence with exercise equipment Exercise tolerated well No report of concerns or symptoms today Strength training completed today  Goals Unmet:  Not Applicable  Comments: Pt able to follow exercise prescription today without complaint.  Will continue to monitor for progression.    Dr. Bethann Punches is Medical Director for [facility] Cardiac Rehabilitation.  Dr. Vida Rigger is Medical Director for [facility] Pulmonary Rehabilitation.

## 2023-06-28 ENCOUNTER — Encounter: Payer: 59 | Admitting: *Deleted

## 2023-06-28 DIAGNOSIS — Z955 Presence of coronary angioplasty implant and graft: Secondary | ICD-10-CM

## 2023-06-28 DIAGNOSIS — Z48812 Encounter for surgical aftercare following surgery on the circulatory system: Secondary | ICD-10-CM | POA: Diagnosis not present

## 2023-06-28 NOTE — Progress Notes (Signed)
Daily Session Note  Patient Details  Name: Brandi Roach MRN: [id number] Date of Birth: 1955-07-13 Referring Provider:   Flowsheet Row Cardiac Rehab from 02/24/2023 in [facility] Cardiac and Pulmonary Rehab  Referring Provider Dr. Youlanda Mighty       Encounter Date: 06/28/2023  Check In:  Session Check In - 06/28/23 0817       Check-In   Supervising physician immediately available to respond to emergencies See telemetry face sheet for immediately available ER MD    Location [facility]-Cardiac & Pulmonary Rehab    Staff Present Cora Collum, RN, BSN, CCRP;Kelly Madilyn Fireman BS, ACSM CEP, Exercise Physiologist;Joseph Hood RCP,RRT,BSRT;Jason Wallace Cullens RDN,LDN    Virtual Visit No    Medication changes reported     No    Fall or balance concerns reported    No    Warm-up and Cool-down Performed on first and last piece of equipment    Resistance Training Performed Yes    VAD Patient? No    PAD/SET Patient? No      Pain Assessment   Currently in Pain? No/denies                Social History   Tobacco Use  Smoking Status Never  Smokeless Tobacco Never    Goals Met:  Independence with exercise equipment Exercise tolerated well No report of concerns or symptoms today  Goals Unmet:  Not Applicable  Comments: Pt able to follow exercise prescription today without complaint.  Will continue to monitor for progression.    Dr. Bethann Punches is Medical Director for [facility] Cardiac Rehabilitation.  Dr. Vida Rigger is Medical Director for [facility] Pulmonary Rehabilitation.

## 2023-07-05 ENCOUNTER — Encounter: Payer: 59 | Admitting: *Deleted

## 2023-07-05 DIAGNOSIS — Z48812 Encounter for surgical aftercare following surgery on the circulatory system: Secondary | ICD-10-CM | POA: Diagnosis not present

## 2023-07-05 DIAGNOSIS — Z955 Presence of coronary angioplasty implant and graft: Secondary | ICD-10-CM

## 2023-07-05 NOTE — Progress Notes (Signed)
 Daily Session Note  Patient Details  Name: Brandi Roach MRN: [id number] Date of Birth: 14-Mar-1956 Referring Provider:   Flowsheet Row Cardiac Rehab from 02/24/2023 in [facility] Cardiac and Pulmonary Rehab  Referring Provider Dr. Youlanda Mighty       Encounter Date: 07/05/2023  Check In:  Session Check In - 07/05/23 0803       Check-In   Supervising physician immediately available to respond to emergencies See telemetry face sheet for immediately available ER MD    Location [facility]-Cardiac & Pulmonary Rehab    Staff Present Cora Collum, RN, BSN, CCRP;Kelly Hayes BS, ACSM CEP, Exercise Physiologist;Joseph Hood RCP,RRT,BSRT    Virtual Visit No    Medication changes reported     No    Fall or balance concerns reported    No    Warm-up and Cool-down Performed on first and last piece of equipment    Resistance Training Performed Yes    VAD Patient? No    PAD/SET Patient? No      Pain Assessment   Currently in Pain? No/denies                Social History   Tobacco Use  Smoking Status Never  Smokeless Tobacco Never    Goals Met:  Independence with exercise equipment Exercise tolerated well No report of concerns or symptoms today  Goals Unmet:  Not Applicable  Comments: Pt able to follow exercise prescription today without complaint.  Will continue to monitor for progression.    Dr. Bethann Punches is Medical Director for [facility] Cardiac Rehabilitation.  Dr. Vida Rigger is Medical Director for [facility] Pulmonary Rehabilitation.

## 2023-07-07 ENCOUNTER — Encounter: Payer: 59 | Admitting: *Deleted

## 2023-07-07 DIAGNOSIS — Z48812 Encounter for surgical aftercare following surgery on the circulatory system: Secondary | ICD-10-CM | POA: Diagnosis not present

## 2023-07-07 DIAGNOSIS — Z955 Presence of coronary angioplasty implant and graft: Secondary | ICD-10-CM

## 2023-07-07 NOTE — Progress Notes (Signed)
 Daily Session Note  Patient Details  Name: Brandi Roach MRN: [id number] Date of Birth: 01/21/1956 Referring Provider:   Flowsheet Row Cardiac Rehab from 02/24/2023 in [facility] Cardiac and Pulmonary Rehab  Referring Provider Dr. Youlanda Mighty       Encounter Date: 07/07/2023  Check In:  Session Check In - 07/07/23 0754       Check-In   Supervising physician immediately available to respond to emergencies See telemetry face sheet for immediately available ER MD    Location [facility]-Cardiac & Pulmonary Rehab    Staff Present Cora Collum, RN, BSN, CCRP;Margaret Best, MS, Exercise Physiologist;Joseph Reino Kent RCP,RRT,BSRT    Virtual Visit No    Medication changes reported     No    Fall or balance concerns reported    No    Warm-up and Cool-down Performed on first and last piece of equipment    Resistance Training Performed Yes    VAD Patient? No    PAD/SET Patient? No      Pain Assessment   Currently in Pain? No/denies                Social History   Tobacco Use  Smoking Status Never  Smokeless Tobacco Never    Goals Met:  Independence with exercise equipment Exercise tolerated well No report of concerns or symptoms today  Goals Unmet:  Not Applicable  Comments: Pt able to follow exercise prescription today without complaint.  Will continue to monitor for progression.    Dr. Bethann Punches is Medical Director for [facility] Cardiac Rehabilitation.  Dr. Vida Rigger is Medical Director for [facility] Pulmonary Rehabilitation.

## 2023-07-09 ENCOUNTER — Encounter: Payer: 59 | Admitting: *Deleted

## 2023-07-09 DIAGNOSIS — Z48812 Encounter for surgical aftercare following surgery on the circulatory system: Secondary | ICD-10-CM | POA: Diagnosis not present

## 2023-07-09 DIAGNOSIS — Z955 Presence of coronary angioplasty implant and graft: Secondary | ICD-10-CM

## 2023-07-09 NOTE — Progress Notes (Signed)
 Daily Session Note  Patient Details  Name: Brandi Roach MRN: [id number] Date of Birth: [birth year] Referring Provider:   Flowsheet Row Cardiac Rehab from 02/24/2023 in [facility] Cardiac and Pulmonary Rehab  Referring Provider Dr. Youlanda Mighty       Encounter Date: 07/09/2023  Check In:  Session Check In - 07/09/23 0724       Check-In   Supervising physician immediately available to respond to emergencies See telemetry face sheet for immediately available ER MD    Location [facility]-Cardiac & Pulmonary Rehab    Staff Present Susann Givens RN,BSN;Joseph Reino Kent RCP,RRT,BSRT;Noah [place], [place], Exercise Physiologist    Virtual Visit No    Medication changes reported     No    Fall or balance concerns reported    No    Warm-up and Cool-down Performed on first and last piece of equipment    Resistance Training Performed Yes    VAD Patient? No    PAD/SET Patient? No      Pain Assessment   Currently in Pain? No/denies                Social History   Tobacco Use  Smoking Status Never  Smokeless Tobacco Never    Goals Met:  Independence with exercise equipment Exercise tolerated well No report of concerns or symptoms today Strength training completed today  Goals Unmet:  Not Applicable  Comments: Pt able to follow exercise prescription today without complaint.  Will continue to monitor for progression.    Dr. Bethann Punches is Medical Director for [facility] Cardiac Rehabilitation.  Dr. Vida Rigger is Medical Director for [facility] Pulmonary Rehabilitation.

## 2023-07-12 ENCOUNTER — Encounter: Payer: 59 | Attending: Cardiovascular Disease | Admitting: *Deleted

## 2023-07-12 DIAGNOSIS — Z955 Presence of coronary angioplasty implant and graft: Secondary | ICD-10-CM

## 2023-07-12 NOTE — Progress Notes (Signed)
 Discharge Summary   Brandi Roach  DOB: 02/07/1956  [name] graduated today from  rehab with 36 sessions completed.  Details of the patient's exercise prescription and what She needs to do in order to continue the prescription and progress were discussed with patient.  Patient was given a copy of prescription and goals.  Patient verbalized understanding. [name] plans to continue to exercise by walking and weights.   6 Minute Walk     Row Name 02/24/23 1140 06/18/23 0743       6 Minute Walk   Phase Initial Discharge    Distance 1660 feet 1810 feet    Distance % Change -- 9.03 %    Distance Feet Change -- 150 ft    Walk Time 6 minutes 6 minutes    # of Rest Breaks 0 0    MPH 3.14 3.43    METS 4.14 4.43    RPE 12 7    Perceived Dyspnea  0 0    VO2 Peak 14.5 15.51    Symptoms No Yes (comment)    Comments -- congested    Resting HR 61 bpm 78 bpm    Resting BP 140/82 116/58    Resting Oxygen Saturation  98 % 96 %    Exercise Oxygen Saturation  during 6 min walk 95 % 89 %    Max Ex. HR 102 bpm 112 bpm    Max Ex. BP 148/82 138/64    2 Minute Post BP 124/72 --

## 2023-07-12 NOTE — Progress Notes (Signed)
 Cardiac Individual Treatment Plan  Patient Details  Name: Brandi Roach MRN: [id number] Date of Birth: July 18, 1955 Referring Provider:   Flowsheet Row Cardiac Rehab from 02/24/2023 in [facility] Cardiac and Pulmonary Rehab  Referring Provider Dr. Youlanda Mighty       Initial Encounter Date:  Flowsheet Row Cardiac Rehab from 02/24/2023 in [facility] Cardiac and Pulmonary Rehab  Date 02/24/23       Visit Diagnosis: Status post coronary artery stent placement  Patient's Home Medications on Admission:  Current Outpatient Medications:    aspirin EC 81 MG tablet, Take 1 tablet by mouth daily., Disp: , Rfl:    atorvastatin (LIPITOR) 80 MG tablet, Take 80 mg by mouth daily., Disp: , Rfl:    Cholecalciferol (VITAMIN D) 50 MCG (2000 UT) tablet, Take 2,000 Units by mouth daily., Disp: , Rfl:    Cholecalciferol 10 MCG (400 UNIT) CAPS, Take 400 Units by mouth daily., Disp: , Rfl:    clopidogrel (PLAVIX) 75 MG tablet, Take 75 mg by mouth daily., Disp: , Rfl:    cyclobenzaprine (FLEXERIL) 5 MG tablet, Take 1 tablet (5 mg total) by mouth 3 (three) times daily as needed for muscle spasms. (Patient not taking: Reported on 06/30/2021), Disp: 30 tablet, Rfl: 1   denosumab (PROLIA) 60 MG/ML SOSY injection, Inject 60 mg into the skin once., Disp: , Rfl:    estradiol (ESTRACE) 0.1 MG/GM vaginal cream, PLACE 1 GRAM VAGINALLY 2 TIMES A WEEK, Disp: , Rfl:    ezetimibe (ZETIA) 10 MG tablet, Take 1 tablet by mouth daily., Disp: , Rfl:    fluorouracil (EFUDEX) 5 % cream, Apply topically., Disp: , Rfl:    lisinopril (ZESTRIL) 5 MG tablet, Take by mouth. (Patient not taking: Reported on 02/18/2023), Disp: , Rfl:    losartan (COZAAR) 100 MG tablet, Take 100 mg by mouth daily., Disp: , Rfl:    meloxicam (MOBIC) 7.5 MG tablet, Take 1 tablet (7.5 mg total) by mouth daily. (Patient not taking: Reported on 06/30/2021), Disp: 10 tablet, Rfl: 0  Past Medical History: No past medical history on file.  Tobacco Use: Social History    Tobacco Use  Smoking Status Never  Smokeless Tobacco Never    Labs: Review Flowsheet       Latest Ref Rng & Units 09/15/2019 06/23/2021 07/13/2022  Labs for ITP Cardiac and Pulmonary Rehab  Cholestrol 100 - 199 mg/dL 782  956  213   LDL (calc) 0 - 99 mg/dL 69  85  68   HDL-C >08 mg/dL 85  95  84   Trlycerides 0 - 149 mg/dL 69  70  65      Exercise Target Goals: Exercise Program Goal: Individual exercise prescription set using results from initial 6 min walk test and THRR while considering  patient's activity barriers and safety.   Exercise Prescription Goal: Initial exercise prescription builds to 30-45 minutes a day of aerobic activity, 2-3 days per week.  Home exercise guidelines will be given to patient during program as part of exercise prescription that the participant will acknowledge.   Education: Aerobic Exercise: - Group verbal and visual presentation on the components of exercise prescription. Introduces F.I.T.T principle from ACSM for exercise prescriptions.  Reviews F.I.T.T. principles of aerobic exercise including progression. Written material given at graduation.   Education: Resistance Exercise: - Group verbal and visual presentation on the components of exercise prescription. Introduces F.I.T.T principle from ACSM for exercise prescriptions  Reviews F.I.T.T. principles of resistance exercise including progression. Written material given at  graduation. Flowsheet Row Cardiac Rehab from 03/31/2023 in [facility] Cardiac and Pulmonary Rehab  Date 03/03/23  Educator NT  Instruction Review Code 1- Verbalizes Understanding        Education: Exercise & Equipment Safety: - Individual verbal instruction and demonstration of equipment use and safety with use of the equipment. Flowsheet Row Cardiac Rehab from 03/31/2023 in [facility] Cardiac and Pulmonary Rehab  Date 02/24/23  Educator [facility]  Instruction Review Code 1- Verbalizes Understanding       Education: Exercise Physiology  & General Exercise Guidelines: - Group verbal and written instruction with models to review the exercise physiology of the cardiovascular system and associated critical values. Provides general exercise guidelines with specific guidelines to those with heart or lung disease.  Flowsheet Row Cardiac Rehab from 03/31/2023 in [facility] Cardiac and Pulmonary Rehab  Education need identified 02/24/23       Education: Flexibility, Balance, Mind/Body Relaxation: - Group verbal and visual presentation with interactive activity on the components of exercise prescription. Introduces F.I.T.T principle from ACSM for exercise prescriptions. Reviews F.I.T.T. principles of flexibility and balance exercise training including progression. Also discusses the mind body connection.  Reviews various relaxation techniques to help reduce and manage stress (i.e. Deep breathing, progressive muscle relaxation, and visualization). Balance handout provided to take home. Written material given at graduation. Flowsheet Row Cardiac Rehab from 03/31/2023 in [facility] Cardiac and Pulmonary Rehab  Date 03/03/23  Educator NT  Instruction Review Code 1- Verbalizes Understanding       Activity Barriers & Risk Stratification:  Activity Barriers & Cardiac Risk Stratification - 02/18/23 1432       Activity Barriers & Cardiac Risk Stratification   Activity Barriers None    Cardiac Risk Stratification Moderate             6 Minute Walk:  6 Minute Walk     Row Name 02/24/23 1140 06/18/23 0743       6 Minute Walk   Phase Initial Discharge    Distance 1660 feet 1810 feet    Distance % Change -- 9.03 %    Distance Feet Change -- 150 ft    Walk Time 6 minutes 6 minutes    # of Rest Breaks 0 0    MPH 3.14 3.43    METS 4.14 4.43    RPE 12 7    Perceived Dyspnea  0 0    VO2 Peak 14.5 15.51    Symptoms No Yes (comment)    Comments -- congested    Resting HR 61 bpm 78 bpm    Resting BP 140/82 116/58    Resting Oxygen  Saturation  98 % 96 %    Exercise Oxygen Saturation  during 6 min walk 95 % 89 %    Max Ex. HR 102 bpm 112 bpm    Max Ex. BP 148/82 138/64    2 Minute Post BP 124/72 --             Oxygen Initial Assessment:   Oxygen Re-Evaluation:   Oxygen Discharge (Final Oxygen Re-Evaluation):   Initial Exercise Prescription:  Initial Exercise Prescription - 02/24/23 1100       Date of Initial Exercise RX and Referring Provider   Date 02/24/23    Referring Provider Dr. Youlanda Mighty      Oxygen   Maintain Oxygen Saturation 88% or higher      Treadmill   MPH 3.5    Grade 1    Minutes 15  METs 4.16      Elliptical   Level 1    Speed 3    Minutes 15    METs 4.14      REL-XR   Level 3    Speed 50    Minutes 15    METs 4.14      Prescription Details   Frequency (times per week) 3    Duration Progress to 30 minutes of continuous aerobic without signs/symptoms of physical distress      Intensity   THRR 40-80% of Max Heartrate 97-134    Ratings of Perceived Exertion 11-13    Perceived Dyspnea 0-4      Progression   Progression Continue to progress workloads to maintain intensity without signs/symptoms of physical distress.      Resistance Training   Training Prescription Yes    Weight 7    Reps 10-15             Perform Capillary Blood Glucose checks as needed.  Exercise Prescription Changes:   Exercise Prescription Changes     Row Name 02/24/23 1100 03/10/23 0900 03/25/23 1400 03/29/23 0800 04/05/23 0800     Response to Exercise   Blood Pressure (Admit) 140/82 108/66 110/66 -- 132/68   Blood Pressure (Exercise) 148/82 144/62 144/60 -- 172/90   Blood Pressure (Exit) 124/72 106/68 102/60 -- 114/60   Heart Rate (Admit) 61 bpm 65 bpm 70 bpm -- 85 bpm   Heart Rate (Exercise) 102 bpm 150 bpm 140 bpm -- 164 bpm   Heart Rate (Exit) 70 bpm 71 bpm 95 bpm -- 116 bpm   Oxygen Saturation (Admit) 98 % -- -- -- --   Oxygen Saturation (Exercise) 95 % -- -- --  --   Oxygen Saturation (Exit) 98 % -- -- -- --   Rating of Perceived Exertion (Exercise) 12 13 13  -- 14   Perceived Dyspnea (Exercise) 0 -- 0 -- 0   Symptoms none none none -- none   Comments 6 MWT results First two days of exercise -- -- --   Duration -- Progress to 30 minutes of  aerobic without signs/symptoms of physical distress Progress to 30 minutes of  aerobic without signs/symptoms of physical distress Progress to 30 minutes of  aerobic without signs/symptoms of physical distress Progress to 30 minutes of  aerobic without signs/symptoms of physical distress   Intensity -- THRR unchanged THRR unchanged THRR New  100-145 THRR unchanged     Progression   Progression -- Continue to progress workloads to maintain intensity without signs/symptoms of physical distress. Continue to progress workloads to maintain intensity without signs/symptoms of physical distress. Continue to progress workloads to maintain intensity without signs/symptoms of physical distress. Continue to progress workloads to maintain intensity without signs/symptoms of physical distress.   Average METs -- 4.04 5.22 5.22 5.22     Resistance Training   Training Prescription -- Yes Yes Yes Yes   Weight -- 7 lb 7 lb 7 lb 7 lb   Reps -- 10-15 10-15 10-15 10-15     Interval Training   Interval Training -- No No No No     Treadmill   MPH -- 3.5 3.7 3.7 4.7   Grade -- 1 1 1  3.5   Minutes -- 15 15 15 15    METs -- 4.16 4.34 4.34 6.87     Elliptical   Level -- 2 6 6 7    Speed -- 3 3 3 4    Minutes -- 15 15  15 15   METs -- -- 6.1 6.1 7.6     REL-XR   Level -- 9 -- -- 10   Minutes -- 15 -- -- 15   METs -- -- -- -- 5.1     Oxygen   Maintain Oxygen Saturation -- 88% or higher 88% or higher 88% or higher 88% or higher    Row Name 04/22/23 0800 05/04/23 1000 05/18/23 1500 06/02/23 1100 06/07/23 0800     Response to Exercise   Blood Pressure (Admit) 110/64 102/64 110/66 114/56 --   Blood Pressure (Exercise) 120/82 -- --  -- --   Blood Pressure (Exit) 98/58 120/64 110/70 102/52 --   Heart Rate (Admit) 85 bpm 83 bpm 71 bpm 98 bpm --   Heart Rate (Exercise) 150 bpm 158 bpm 146 bpm 162 bpm --   Heart Rate (Exit) 99 bpm 99 bpm 109 bpm 101 bpm --   Rating of Perceived Exertion (Exercise) 13 13 13 13  --   Symptoms none none none none --   Duration Continue with 30 min of aerobic exercise without signs/symptoms of physical distress. Continue with 30 min of aerobic exercise without signs/symptoms of physical distress. Continue with 30 min of aerobic exercise without signs/symptoms of physical distress. Continue with 30 min of aerobic exercise without signs/symptoms of physical distress. Continue with 30 min of aerobic exercise without signs/symptoms of physical distress.   Intensity THRR unchanged THRR unchanged THRR unchanged THRR unchanged THRR unchanged     Progression   Progression Continue to progress workloads to maintain intensity without signs/symptoms of physical distress. Continue to progress workloads to maintain intensity without signs/symptoms of physical distress. Continue to progress workloads to maintain intensity without signs/symptoms of physical distress. Continue to progress workloads to maintain intensity without signs/symptoms of physical distress. Continue to progress workloads to maintain intensity without signs/symptoms of physical distress.   Average METs 6.26 6.08 4.78 6.58 6.58     Resistance Training   Training Prescription Yes Yes Yes Yes Yes   Weight 7 lb 7 lb 7 lb 7 lb 7 lb   Reps 10-15 10-15 10-15 10-15 10-15     Interval Training   Interval Training No Yes No Yes Yes   Equipment -- Treadmill -- Treadmill Treadmill   Comments -- every 2 min increasing speed to and incline to 2% -- running intervals of speed 5.5-6 mph running intervals of speed 5.5-6 mph     Treadmill   MPH 4 4 6 6 6    Grade 5 14 2 2 2    Minutes 15 15 15 15 15    METs 6.82 11.78 7.25 7.25 7.25     Elliptical    Level 8 10 -- 9 9   Speed 4 3.8 -- 3.8 3.8   Minutes 15 15 -- 15 15   METs -- 5.5 -- 7.25 7.25     REL-XR   Level 10 9 10 9 9    Minutes 15 15 15 15 15    METs -- 5.2 2.3 5.6 5.6     Home Exercise Plan   Plans to continue exercise at -- -- -- -- Home (comment)  walking and weights   Frequency -- -- -- -- Add 2 additional days to program exercise sessions.   Initial Home Exercises Provided -- -- -- -- 06/07/23     Oxygen   Maintain Oxygen Saturation 88% or higher 88% or higher 88% or higher 88% or higher 88% or higher    Row  Name 06/17/23 0700 06/29/23 1500           Response to Exercise   Blood Pressure (Admit) 104/66 108/64      Blood Pressure (Exercise) -- 138/64      Blood Pressure (Exit) 102/58 100/58      Heart Rate (Admit) 76 bpm 78 bpm      Heart Rate (Exercise) 57 bpm 147 bpm      Heart Rate (Exit) 92 bpm 98 bpm      Rating of Perceived Exertion (Exercise) 13 13      Symptoms none none      Duration Continue with 30 min of aerobic exercise without signs/symptoms of physical distress. Continue with 30 min of aerobic exercise without signs/symptoms of physical distress.      Intensity THRR unchanged THRR unchanged        Progression   Progression Continue to progress workloads to maintain intensity without signs/symptoms of physical distress. Continue to progress workloads to maintain intensity without signs/symptoms of physical distress.      Average METs 7.34 6.8        Resistance Training   Training Prescription Yes Yes      Weight 7 lb 7 lb      Reps 10-15 10-15        Interval Training   Interval Training No Yes      Equipment -- Treadmill      Comments -- running intervals of speed 3.8-5.8 mph w/ 1.5% incline        Treadmill   MPH 9 4      Grade 0 8      Minutes 15 15      METs 7.89 8.47        Elliptical   Level 9 8      Speed 3.8 3.2      Minutes 15 15      METs 7.1 5.9        REL-XR   Level 9 13      Minutes 15 15      METs -- 10.1         Home Exercise Plan   Plans to continue exercise at Home (comment)  walking and weights Home (comment)  walking and weights      Frequency Add 2 additional days to program exercise sessions. Add 2 additional days to program exercise sessions.      Initial Home Exercises Provided 06/07/23 06/07/23        Oxygen   Maintain Oxygen Saturation 88% or higher 88% or higher               Exercise Comments:   Exercise Comments     Row Name 03/01/23 1914 03/29/23 0900         Exercise Comments First full day of exercise!  Patient was oriented to gym and equipment including functions, settings, policies, and procedures.  Patient's individual exercise prescription and treatment plan were reviewed.  All starting workloads were established based on the results of the 6 minute walk test done at initial orientation visit.  The plan for exercise progression was also introduced and progression will be customized based on patient's performance and goals. THRR was recalculate for Ralynn due to her current work loads and response to exercise. New THRR is 100-145 bpm.               Exercise Goals and Review:   Exercise Goals     Row Name  02/24/23 1154             Exercise Goals   Increase Physical Activity Yes       Intervention Provide advice, education, support and counseling about physical activity/exercise needs.;Develop an individualized exercise prescription for aerobic and resistive training based on initial evaluation findings, risk stratification, comorbidities and participant's personal goals.       Expected Outcomes Short Term: Attend rehab on a regular basis to increase amount of physical activity.;Long Term: Add in home exercise to make exercise part of routine and to increase amount of physical activity.;Long Term: Exercising regularly at least 3-5 days a week.       Increase Strength and Stamina Yes       Intervention Provide advice, education, support and counseling about  physical activity/exercise needs.;Develop an individualized exercise prescription for aerobic and resistive training based on initial evaluation findings, risk stratification, comorbidities and participant's personal goals.       Expected Outcomes Short Term: Increase workloads from initial exercise prescription for resistance, speed, and METs.;Short Term: Perform resistance training exercises routinely during rehab and add in resistance training at home;Long Term: Improve cardiorespiratory fitness, muscular endurance and strength as measured by increased METs and functional capacity ( )       Able to understand and use rate of perceived exertion (RPE) scale Yes       Intervention Provide education and explanation on how to use RPE scale       Expected Outcomes Short Term: Able to use RPE daily in rehab to express subjective intensity level;Long Term:  Able to use RPE to guide intensity level when exercising independently       Able to understand and use Dyspnea scale Yes       Intervention Provide education and explanation on how to use Dyspnea scale       Expected Outcomes Short Term: Able to use Dyspnea scale daily in rehab to express subjective sense of shortness of breath during exertion;Long Term: Able to use Dyspnea scale to guide intensity level when exercising independently       Knowledge and understanding of Target Heart Rate Range (THRR) Yes       Intervention Provide education and explanation of THRR including how the numbers were predicted and where they are located for reference       Expected Outcomes Short Term: Able to state/look up THRR;Long Term: Able to use THRR to govern intensity when exercising independently;Short Term: Able to use daily as guideline for intensity in rehab       Able to check pulse independently Yes       Intervention Provide education and demonstration on how to check pulse in carotid and radial arteries.;Review the importance of being able to check your own  pulse for safety during independent exercise       Expected Outcomes Short Term: Able to explain why pulse checking is important during independent exercise;Long Term: Able to check pulse independently and accurately       Understanding of Exercise Prescription Yes       Intervention Provide education, explanation, and written materials on patient's individual exercise prescription       Expected Outcomes Short Term: Able to explain program exercise prescription;Long Term: Able to explain home exercise prescription to exercise independently                Exercise Goals Re-Evaluation :  Exercise Goals Re-Evaluation     Row Name 03/01/23 [phone] 03/10/23 [phone] 03/25/23  1459 03/29/23 0900 04/05/23 0841     Exercise Goal Re-Evaluation   Exercise Goals Review Able to understand and use rate of perceived exertion (RPE) scale;Knowledge and understanding of Target Heart Rate Range (THRR);Able to understand and use Dyspnea scale;Understanding of Exercise Prescription Increase Physical Activity;Increase Strength and Stamina;Understanding of Exercise Prescription Increase Physical Activity;Increase Strength and Stamina;Understanding of Exercise Prescription -- Increase Physical Activity;Increase Strength and Stamina;Understanding of Exercise Prescription   Comments Reviewed RPE and dyspnea scale, THR and program prescription with pt today.  Pt voiced understanding and was given a copy of goals to take home. Leitha is off to a good start in the program. She did well on the treadmill during her first two sessions at a speed of 3.5 mph and an incline of 1%. She also worked on the elliptical at level 2 and the XR at level 9. We will continue to monitor her progress in the program. Feven continues to make improvements in the program. She has been able to increase her level on the elliptical from level 2 to 6. She has also increased her speed on the treadmill from 3. to 3.47mph. We will continue to monitor her  progress in the program. THRR was recalculate for Graclynn due to her current work loads and response to exercise. New THRR is 100-145 bpm. Aubrianne continues to make improvements in rehab. She has recently been able to increase her workload on the treadmill to a speed of 4.7 mph and an incline of 3.5%. She also has been able to increase her level on the XR from level 9 to level 10. We will continue to monitor her progress in the program.   Expected Outcomes Short: Use RPE daily to regulate intensity. Long: Follow program prescription in THR. Short: Continue to follow current exercise prescription. Long: Continue exercise to improve strength and stamina. Short: Continue to follow current exercise prescription. Long: Continue exercise to improve strength and stamina. -- Short: Continue to follow current exercise prescription. Long: Continue exercise to improve strength and stamina.    Row Name 04/22/23 0846 05/04/23 1037 05/18/23 1506 06/02/23 1136 06/07/23 0802     Exercise Goal Re-Evaluation   Exercise Goals Review Increase Physical Activity;Increase Strength and Stamina;Understanding of Exercise Prescription Increase Physical Activity;Increase Strength and Stamina;Understanding of Exercise Prescription Increase Physical Activity;Increase Strength and Stamina;Understanding of Exercise Prescription Increase Physical Activity;Increase Strength and Stamina;Understanding of Exercise Prescription Increase Physical Activity;Able to understand and use rate of perceived exertion (RPE) scale;Knowledge and understanding of Target Heart Rate Range (THRR);Understanding of Exercise Prescription;Increase Strength and Stamina;Able to understand and use Dyspnea scale;Able to check pulse independently   Comments Denai is doing well in rehab. She recently increased her incline on the treadmill to 5% while maintaining a speed of 4 mph. She also improved to level 8 on the elliptical and continues to work at level 10 on the XR. We will  continue to monitor her progress in the program. Breindel continues to do well in rehab. She increased her incline on the treadmill to 14% with a speed of . She also tried intervals on the treadmill increasing every 2 minutes to a speed of with incline of 2%. She also increased to level 10 on the elliptical. We will continue to monitor her progress in the program. Aurora has only attended one session since the last review. During that session she was able to work on the treadmill at a speed of 6 mph with a 2% incline and used the XR at  level 10. We will continue to monitor her progress in the program. Maiya has only attended two sessions since the last review. She is doing well on the treadmill at running intervals ranging from 5.5-6 mph with a 2% incline. She also continues to work at level 9 on the XR and elliptcal. We will continue to monitor her progress in the program. Reviewed home exercise with pt today.  Pt plans to walk and use weights for exercise.  Reviewed THR, pulse, RPE, sign and symptoms, pulse oximetery and when to call 911 or MD.  Also discussed weather considerations and indoor options.  Pt voiced understanding.   Expected Outcomes Short: Continue to progressively increase treadmill workload. Long: Continue exercise to improve strength and stamina. Short: Continue to progressively increase treadmill and elliptical workloads and trying interval training on the treadmill. Long: Continue exercise to improve strength and stamina. Short: Attend rehab more consistently. Long: Continue exercise to improve strength and stamina. Short: Attend rehab more consistently. Long: Continue exercise to improve strength and stamina. Shot: add 1-2 days a week of exercise at home on off days of rehab. Long: become independent with exercise routine.    Row Name 06/17/23 0739 06/29/23 1520           Exercise Goal Re-Evaluation   Exercise Goals Review Increase Physical Activity;Increase Strength and  Stamina;Understanding of Exercise Prescription Increase Physical Activity;Increase Strength and Stamina;Understanding of Exercise Prescription      Comments Kassidie continues to do well in rehab. She is due for her post and will look to improve on it. She continues to work at level 9 on both the XR and elliptical. She also increased her speed on the treadmill to a speed of 9 mph with 0% incline when running, and a speed of 3.8 mph with 8.5% incline for walking. We will continue to monitor her progress in the program. Donnalee continues to do well in rehab and is getting ready to graduate. She improved 9.03% on her walk test with 1851ft. She increased to level 13 on the XR. She also increased her incline on the treadmill to 8% and did running intervals at a speed of 5. and incline of 1.5%. We will continue to monitor her progress in the program.      Expected Outcomes Short: Improve on post . Long: Continue exercise to improve strength and stamina. Short: Graduate. Long: Continue to exercise independently.               Discharge Exercise Prescription (Final Exercise Prescription Changes):  Exercise Prescription Changes - 06/29/23 1500       Response to Exercise   Blood Pressure (Admit) 108/64    Blood Pressure (Exercise) 138/64    Blood Pressure (Exit) 100/58    Heart Rate (Admit) 78 bpm    Heart Rate (Exercise) 147 bpm    Heart Rate (Exit) 98 bpm    Rating of Perceived Exertion (Exercise) 13    Symptoms none    Duration Continue with 30 min of aerobic exercise without signs/symptoms of physical distress.    Intensity THRR unchanged      Progression   Progression Continue to progress workloads to maintain intensity without signs/symptoms of physical distress.    Average METs 6.8      Resistance Training   Training Prescription Yes    Weight 7 lb    Reps 10-15      Interval Training   Interval Training Yes    Equipment Treadmill  Comments running intervals of speed  3.8-5.8 mph w/ 1.5% incline      Treadmill   MPH 4    Grade 8    Minutes 15    METs 8.47      Elliptical   Level 8    Speed 3.2    Minutes 15    METs 5.9      REL-XR   Level 13    Minutes 15    METs 10.1      Home Exercise Plan   Plans to continue exercise at Home (comment)   walking and weights   Frequency Add 2 additional days to program exercise sessions.    Initial Home Exercises Provided 06/07/23      Oxygen   Maintain Oxygen Saturation 88% or higher             Nutrition:  Target Goals: Understanding of nutrition guidelines, daily intake of sodium 1500mg , cholesterol 200mg , calories 30% from fat and 7% or less from saturated fats, daily to have 5 or more servings of fruits and vegetables.  Education: All About Nutrition: -Group instruction provided by verbal, written material, interactive activities, discussions, models, and posters to present general guidelines for heart healthy nutrition including fat, fiber, MyPlate, the role of sodium in heart healthy nutrition, utilization of the nutrition label, and utilization of this knowledge for meal planning. Follow up email sent as well. Written material given at graduation. Flowsheet Row Cardiac Rehab from 03/31/2023 in [facility] Cardiac and Pulmonary Rehab  Date 03/24/23  Educator JG  Instruction Review Code 1- Verbalizes Understanding       Biometrics:  Pre Biometrics - 02/24/23 1156       Pre Biometrics   Height 5' 4.5" (1.638 m)    Weight 124 lb (56.2 kg)    Waist Circumference 30.5 inches    Hip Circumference 37 inches    Waist to Hip Ratio 0.82 %    BMI (Calculated) 20.96    Single Leg Stand 30 seconds             Post Biometrics - 06/18/23 0745        Post  Biometrics   Height 5' 4.5" (1.638 m)    Weight 123 lb 14.4 oz (56.2 kg)    Waist Circumference 31 inches    Hip Circumference 34 inches    Waist to Hip Ratio 0.91 %    BMI (Calculated) 20.95    Single Leg Stand 30 seconds              Nutrition Therapy Plan and Nutrition Goals:  Nutrition Therapy & Goals - 02/24/23 1507       Nutrition Therapy   Diet cardiac, Low na    Protein (specify units) 75    Fiber 25 grams    Whole Grain Foods 3 servings    Saturated Fats 15 max. grams    Fruits and Vegetables 5 servings/day    Sodium 2 grams      Personal Nutrition Goals   Nutrition Goal Drink 64oz of water    Personal Goal #2 Continue to build balanced plates and meet nutrition goals    Personal Goal #3 When snacking ask why.    Comments Patient drinking 45oz of water, set goal to aim for ~64oz daily. She is vegetarian and has structured eating patterns. She eats lots of fruits and veggies, includes good sources of plant based protein as well as some dairy and eggs in moderation. Reviewed Mediterranean  diet handout, types of fats, sources, and how to read labels. Went over a few fact labels as well, educated on ways to keep sodium intake less than 2300mg . Built out meals and snacks focused on meeting needs with vegetarian friendly foods that she likes and will eat.      Intervention Plan   Intervention Prescribe, educate and counsel regarding individualized specific dietary modifications aiming towards targeted core components such as weight, hypertension, lipid management, diabetes, heart failure and other comorbidities.;Nutrition handout(s) given to patient.    Expected Outcomes Short Term Goal: Understand basic principles of dietary content, such as calories, fat, sodium, cholesterol and nutrients.;Short Term Goal: A plan has been developed with personal nutrition goals set during dietitian appointment.;Long Term Goal: Adherence to prescribed nutrition plan.             Nutrition Assessments:  MEDIFICTS Score Key: >=70 Need to make dietary changes  40-70 Heart Healthy Diet <= 40 Therapeutic Level Cholesterol Diet  Flowsheet Row Cardiac Rehab from 06/25/2023 in [facility] Cardiac and Pulmonary Rehab  Picture  Your Plate Total Score on Admission 85  Picture Your Plate Total Score on Discharge 86      Picture Your Plate Scores: <40 Unhealthy dietary pattern with much room for improvement. 41-50 Dietary pattern unlikely to meet recommendations for good health and room for improvement. 51-60 More healthful dietary pattern, with some room for improvement.  >60 Healthy dietary pattern, although there may be some specific behaviors that could be improved.    Nutrition Goals Re-Evaluation:  Nutrition Goals Re-Evaluation     Row Name 03/31/23 0747 05/19/23 0731 06/09/23 0732         Goals   Comment Loza is using a system that helps her track how much water she is drinking. Her bottle has a measure on it to keep track of how much water she is eating. She has no other questions with her diet at this time. Noheli is working on drinking her water more. She has not been as good as she wants to be with her intake. She is going to try to intake more water by using battles with markers on them. Sarah feels as though she is managing her diet well and getting her required water intake.     Expected Outcome Short: continue to drink more water. Long: maintain a diet that adheres to her. Short: drink more water. Long: maintain water intake independently. Short: Continue to manage diet and drink a consistent amount of water. Long: Maintain a heart healthy diet              Nutrition Goals Discharge (Final Nutrition Goals Re-Evaluation):  Nutrition Goals Re-Evaluation - 06/09/23 0732       Goals   Comment Marykatherine feels as though she is managing her diet well and getting her required water intake.    Expected Outcome Short: Continue to manage diet and drink a consistent amount of water. Long: Maintain a heart healthy diet             Psychosocial: Target Goals: Acknowledge presence or absence of significant depression and/or stress, maximize coping skills, provide positive support system. Participant is able  to verbalize types and ability to use techniques and skills needed for reducing stress and depression.   Education: Stress, Anxiety, and Depression - Group verbal and visual presentation to define topics covered.  Reviews how body is impacted by stress, anxiety, and depression.  Also discusses healthy ways to reduce stress and to  treat/manage anxiety and depression.  Written material given at graduation.   Education: Sleep Hygiene -Provides group verbal and written instruction about how sleep can affect your health.  Define sleep hygiene, discuss sleep cycles and impact of sleep habits. Review good sleep hygiene tips.    Initial Review & Psychosocial Screening:  Initial Psych Review & Screening - 02/18/23 1437       Initial Review   Current issues with Current Stress Concerns    Source of Stress Concerns Financial      Family Dynamics   Good Support System? Yes      Screening Interventions   Interventions Encouraged to exercise;Provide feedback about the scores to participant;To provide support and resources with identified psychosocial needs    Expected Outcomes Long Term Goal: Stressors or current issues are controlled or eliminated.;Short Term goal: Utilizing psychosocial counselor, staff and physician to assist with identification of specific Stressors or current issues interfering with healing process. Setting desired goal for each stressor or current issue identified.;Short Term goal: Identification and review with participant of any Quality of Life or Depression concerns found by scoring the questionnaire.;Long Term goal: The participant improves quality of Life and PHQ9 Scores as seen by post scores and/or verbalization of changes             Quality of Life Scores:   Quality of Life - 06/25/23 0729       Quality of Life Scores   Health/Function Pre 24.5 %    Health/Function Post 26.4 %    Health/Function % Change 7.76 %    Socioeconomic Pre 23.06 %    Socioeconomic  Post 25.88 %    Socioeconomic % Change  12.23 %    Psych/Spiritual Pre 22.5 %    Psych/Spiritual Post 28.14 %    Psych/Spiritual % Change 25.07 %    Family Pre 27.6 %    Family Post 28.8 %    Family % Change 4.35 %    GLOBAL Pre 24.21 %    GLOBAL Post 26.97 %    GLOBAL % Change 11.4 %            Scores of 19 and below usually indicate a poorer quality of life in these areas.  A difference of  2-3 points is a clinically meaningful difference.  A difference of 2-3 points in the total score of the Quality of Life Index has been associated with significant improvement in overall quality of life, self-image, physical symptoms, and general health in studies assessing change in quality of life.  PHQ-9: Review Flowsheet       06/25/2023 02/24/2023  Depression screen PHQ 2/9  Decreased Interest 0 0  Down, Depressed, Hopeless 0 0  PHQ - 2 Score 0 0  Altered sleeping 0 0  Tired, decreased energy 0 0  Change in appetite 0 0  Feeling bad or failure about yourself  0 0  Trouble concentrating 0 0  Moving slowly or fidgety/restless 0 0  Suicidal thoughts 0 0  PHQ-9 Score 0 0  Difficult doing work/chores Not difficult at all Not difficult at all   Interpretation of Total Score  Total Score Depression Severity:  1-4 = Minimal depression, 5-9 = Mild depression, 10-14 = Moderate depression, 15-19 = Moderately severe depression, 20-27 = Severe depression   Psychosocial Evaluation and Intervention:  Psychosocial Evaluation - 02/18/23 1452       Psychosocial Evaluation & Interventions   Interventions Encouraged to exercise with the program and follow  exercise prescription;Stress management education;Relaxation education    Comments 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 She feels like she is recovering well. She is usually a very active person who enjoys walking and especially swimming. She was instructed not to return to those activities at her previous pace until she  starts cardiac rehab, so she is very ready to start the program. When asked about her mental health, she states that there is some stress related to whether or not she should retire and the financial involvement. She has a great support system.    Expected Outcomes Short: attend cardiac rehab for education and exercise. Long: Develop and maintain positive self care habits    Continue Psychosocial Services  Follow up required by staff             Psychosocial Re-Evaluation:  Psychosocial Re-Evaluation     Row Name 03/31/23 0746 05/19/23 0731 06/09/23 0728         Psychosocial Re-Evaluation   Current issues with None Identified None Identified None Identified     Comments Patient reports no issues with their current mental states, sleep, stress, depression or anxiety. Will follow up with patient in a few weeks for any changes. Patient reports no issues with their current mental states, sleep, stress, depression or anxiety. Will follow up with patient in a few weeks for any changes. Evalise states that she has no major stressors at this time, she does feel that the political climate is giving her some unease. Seryna also states that she is having no sleeping issues. We will follow up with patients in a few weeks for any changes.     Expected Outcomes Short: Continue to exercise regularly to support mental health and notify staff of any changes. Long: maintain mental health and well being through teaching of rehab or prescribed medications independently. Short: Continue to exercise regularly to support mental health and notify staff of any changes. Long: maintain mental health and well being through teaching of rehab or prescribed medications independently. Short: Continue to exercise regularly to support mental health and notify staff of any changes. Long: maintain mental health and well being through teaching of rehab or prescribed medications independently.     Interventions Encouraged to attend  Cardiac Rehabilitation for the exercise Encouraged to attend Cardiac Rehabilitation for the exercise Encouraged to attend Cardiac Rehabilitation for the exercise     Continue Psychosocial Services  Follow up required by staff Follow up required by staff Follow up required by staff              Psychosocial Discharge (Final Psychosocial Re-Evaluation):  Psychosocial Re-Evaluation - 06/09/23 0728       Psychosocial Re-Evaluation   Current issues with None Identified    Comments Grizel states that she has no major stressors at this time, she does feel that the political climate is giving her some unease. Senai also states that she is having no sleeping issues. We will follow up with patients in a few weeks for any changes.    Expected Outcomes Short: Continue to exercise regularly to support mental health and notify staff of any changes. Long: maintain mental health and well being through teaching of rehab or prescribed medications independently.    Interventions Encouraged to attend Cardiac Rehabilitation for the exercise    Continue Psychosocial Services  Follow up required by staff             Vocational Rehabilitation: Provide vocational rehab assistance to  qualifying candidates.   Vocational Rehab Evaluation & Intervention:  Vocational Rehab - 02/18/23 1437       Initial Vocational Rehab Evaluation & Intervention   Assessment shows need for Vocational Rehabilitation No             Education: Education Goals: Education classes will be provided on a variety of topics geared toward better understanding of heart health and risk factor modification. Participant will state understanding/return demonstration of topics presented as noted by education test scores.  Learning Barriers/Preferences:  Learning Barriers/Preferences - 02/18/23 1437       Learning Barriers/Preferences   Learning Barriers None    Learning Preferences None             General Cardiac Education  Topics:  AED/CPR: - Group verbal and written instruction with the use of models to demonstrate the basic use of the AED with the basic ABC's of resuscitation.   Anatomy and Cardiac Procedures: - Group verbal and visual presentation and models provide information about basic cardiac anatomy and function. Reviews the testing methods done to diagnose heart disease and the outcomes of the test results. Describes the treatment choices: Medical Management, Angioplasty, or Coronary Bypass Surgery for treating various heart conditions including Myocardial Infarction, Angina, Valve Disease, and Cardiac Arrhythmias.  Written material given at graduation. Flowsheet Row Cardiac Rehab from 03/31/2023 in [facility] Cardiac and Pulmonary Rehab  Date 03/31/23  Educator SB  Instruction Review Code 1- Verbalizes Understanding       Medication Safety: - Group verbal and visual instruction to review commonly prescribed medications for heart and lung disease. Reviews the medication, class of the drug, and side effects. Includes the steps to properly store meds and maintain the prescription regimen.  Written material given at graduation.   Intimacy: - Group verbal instruction through game format to discuss how heart and lung disease can affect sexual intimacy. Written material given at graduation..   Know Your Numbers and Heart Failure: - Group verbal and visual instruction to discuss disease risk factors for cardiac and pulmonary disease and treatment options.  Reviews associated critical values for Overweight/Obesity, Hypertension, Cholesterol, and Diabetes.  Discusses basics of heart failure: signs/symptoms and treatments.  Introduces Heart Failure Zone chart for action plan for heart failure.  Written material given at graduation. Flowsheet Row Cardiac Rehab from 03/31/2023 in [facility] Cardiac and Pulmonary Rehab  Education need identified 02/24/23       Infection Prevention: - Provides verbal and written  material to individual with discussion of infection control including proper hand washing and proper equipment cleaning during exercise session. Flowsheet Row Cardiac Rehab from 03/31/2023 in [facility] Cardiac and Pulmonary Rehab  Date 02/24/23  Educator [facility]  Instruction Review Code 1- Verbalizes Understanding       Falls Prevention: - Provides verbal and written material to individual with discussion of falls prevention and safety. Flowsheet Row Cardiac Rehab from 03/31/2023 in [facility] Cardiac and Pulmonary Rehab  Date 02/24/23  Educator [facility]  Instruction Review Code 1- Verbalizes Understanding       Other: -Provides group and verbal instruction on various topics (see comments)   Knowledge Questionnaire Score:  Knowledge Questionnaire Score - 06/25/23 0729       Knowledge Questionnaire Score   Pre Score 24/26    Post Score 26/26             Core Components/Risk Factors/Patient Goals at Admission:  Personal Goals and Risk Factors at Admission - 02/24/23 1157  Core Components/Risk Factors/Patient Goals on Admission    Weight Management Yes    Intervention Weight Management: Develop a combined nutrition and exercise program designed to reach desired caloric intake, while maintaining appropriate intake of nutrient and fiber, sodium and fats, and appropriate energy expenditure required for the weight goal.;Weight Management: Provide education and appropriate resources to help participant work on and attain dietary goals.;Weight Management/Obesity: Establish reasonable short term and long term weight goals.    Admit Weight 124 lb (56.2 kg)    Goal Weight: Short Term 124 lb (56.2 kg)    Goal Weight: Long Term 124 lb (56.2 kg)    Expected Outcomes Short Term: Continue to assess and modify interventions until short term weight is achieved;Long Term: Adherence to nutrition and physical activity/exercise program aimed toward attainment of established weight goal;Weight Maintenance:  Understanding of the daily nutrition guidelines, which includes 25-35% calories from fat, 7% or less cal from saturated fats, less than 200mg  cholesterol, less than 1.5gm of sodium, & 5 or more servings of fruits and vegetables daily;Understanding recommendations for meals to include 15-35% energy as protein, 25-35% energy from fat, 35-60% energy from carbohydrates, less than 200mg  of dietary cholesterol, 20-35 gm of total fiber daily;Understanding of distribution of calorie intake throughout the day with the consumption of 4-5 meals/snacks    Hypertension Yes    Intervention Provide education on lifestyle modifcations including regular physical activity/exercise, weight management, moderate sodium restriction and increased consumption of fresh fruit, vegetables, and low fat dairy, alcohol moderation, and smoking cessation.;Monitor prescription use compliance.    Expected Outcomes Short Term: Continued assessment and intervention until BP is < 140/7mm HG in hypertensive participants. < 130/79mm HG in hypertensive participants with diabetes, heart failure or chronic kidney disease.;Long Term: Maintenance of blood pressure at goal levels.    Lipids Yes    Intervention Provide education and support for participant on nutrition & aerobic/resistive exercise along with prescribed medications to achieve LDL 70mg , HDL >40mg .    Expected Outcomes Short Term: Participant states understanding of desired cholesterol values and is compliant with medications prescribed. Participant is following exercise prescription and nutrition guidelines.;Long Term: Cholesterol controlled with medications as prescribed, with individualized exercise RX and with personalized nutrition plan. Value goals: LDL < 70mg , HDL > 40 mg.             Education:Diabetes - Individual verbal and written instruction to review signs/symptoms of diabetes, desired ranges of glucose level fasting, after meals and with exercise. Acknowledge that  pre and post exercise glucose checks will be done for 3 sessions at entry of program.   Core Components/Risk Factors/Patient Goals Review:   Goals and Risk Factor Review     Row Name 03/31/23 0749 05/19/23 0737 06/09/23 0735         Core Components/Risk Factors/Patient Goals Review   Personal Goals Review Weight Management/Obesity;Other Other Other     Review Felica is doing well in the program and is maintaining her weight. Her blood pressure is doing well and is usually never hypertensive. She has no questions on her medications and will continue to exercise. Evelena states she has no questionsa about her health at this time and plans to do all 36 sessions of rehab. Jammie states she has no questions about her health at this time and plans to do all 36 sessions of rehab.     Expected Outcomes Short: continue exercise. Long: maintain exercise post [facility]. 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 Short: Graduate  [employer]. Long: maintain home exercise post Graduation.              Core Components/Risk Factors/Patient Goals at Discharge (Final Review):   Goals and Risk Factor Review - 06/09/23 0735       Core Components/Risk Factors/Patient Goals Review   Personal Goals Review Other    Review Wallis states she has no questions about her health at this time and plans to do all 36 sessions of rehab.    Expected Outcomes Short: Graduate [employer]. Long: maintain home exercise post Graduation.             ITP Comments:  ITP Comments     Row Name 02/18/23 1446 02/24/23 1139 03/01/23 0821 03/10/23 1517 03/31/23 1118   ITP Comments Initial phone call completed. Diagnosis can be found in [facility] 9/12. EP Orientation scheduled for Wednesday 10/16 at 10am. Completed and gym orientation. Initial ITP created and sent for review to Dr. Bethann Punches, Medical Director. First full day of exercise!  Patient was oriented to gym and equipment including functions,  settings, policies, and procedures.  Patient's individual exercise prescription and treatment plan were reviewed.  All starting workloads were established based on the results of the 6 minute walk test done at initial orientation visit.  The plan for exercise progression was also introduced and progression will be customized based on patient's performance and goals. 30 Day review completed. Medical Director ITP review done, changes made as directed, and signed approval by Medical Director.     new to program 30 Day review completed. Medical Director ITP review done, changes made as directed, and signed approval by Medical Director.    Row Name 04/28/23 1137 05/26/23 1504 06/23/23 0805 07/12/23 0753     ITP Comments 30 Day review completed. Medical Director ITP review done, changes made as directed, and signed approval by Medical Director. 30 Day review completed. Medical Director ITP review done, changes made as directed, and signed approval by Medical Director. 30 Day review completed. Medical Director ITP review done, changes made as directed, and signed approval by Medical Director. Reighlyn graduated today from  rehab with 36 sessions completed.  Details of the patient's exercise prescription and what She needs to do in order to continue the prescription and progress were discussed with patient.  Patient was given a copy of prescription and goals.  Patient verbalized understanding. Cleo plans to continue to exercise by walking and weights.             Comments: Discharge ITP

## 2023-07-12 NOTE — Progress Notes (Signed)
 Daily Session Note  Patient Details  Name: Brandi Roach MRN: [id number] Date of Birth: 1955/10/28 Referring Provider:   Flowsheet Row Cardiac Rehab from 02/24/2023 in [facility]  Referring Provider Dr. Youlanda Mighty       Encounter Date: 07/12/2023  Check In:  Session Check In - 07/12/23 0752       Check-In   Supervising physician immediately available to respond to emergencies See telemetry face sheet for immediately available ER MD    Location [facility]-Cardiac & Pulmonary Rehab    Staff Present Susann Givens RN,BSN;[facility] Madilyn Fireman BS, ACSM CEP, Exercise Physiologist    Virtual Visit No    Medication changes reported     No    Fall or balance concerns reported    No    Warm-up and Cool-down Performed on first and last piece of equipment    Resistance Training Performed Yes    VAD Patient? No    PAD/SET Patient? No      Pain Assessment   Currently in Pain? No/denies                Social History   Tobacco Use  Smoking Status Never  Smokeless Tobacco Never    Goals Met:  Independence with exercise equipment Exercise tolerated well No report of concerns or symptoms today Strength training completed today  Goals Unmet:  Not Applicable  Comments:  Brandi Roach graduated today from  rehab with 36 sessions completed.  Details of the patient's exercise prescription and what She needs to do in order to continue the prescription and progress were discussed with patient.  Patient was given a copy of prescription and goals.  Patient verbalized understanding. Brandi Roach plans to continue to exercise by walking and weights.    Dr. Bethann Punches is Medical Director for [facility] Cardiac Rehabilitation.  Dr. Vida Rigger is Medical Director for [facility] Pulmonary Rehabilitation.

## 2023-08-12 ENCOUNTER — Ambulatory Visit: Payer: Self-pay

## 2023-08-12 DIAGNOSIS — Z0000 Encounter for general adult medical examination without abnormal findings: Secondary | ICD-10-CM

## 2023-08-12 DIAGNOSIS — M810 Age-related osteoporosis without current pathological fracture: Secondary | ICD-10-CM

## 2023-08-12 NOTE — Progress Notes (Signed)
 Pt completed labs for Dr. Athena Masse

## 2023-08-14 LAB — CMP12+LP+TP+TSH+6AC+CBC/D/PLT
ALT: 28 IU/L (ref 0–32)
AST: 28 IU/L (ref 0–40)
Albumin: 4.1 g/dL (ref 3.9–4.9)
Alkaline Phosphatase: 59 IU/L (ref 44–121)
BUN/Creatinine Ratio: 24 (ref 12–28)
BUN: 17 mg/dL (ref 8–27)
Basophils Absolute: 0.1 10*3/uL (ref 0.0–0.2)
Basos: 1 %
Bilirubin Total: 0.5 mg/dL (ref 0.0–1.2)
Calcium: 9 mg/dL (ref 8.7–10.3)
Chloride: 101 mmol/L (ref 96–106)
Chol/HDL Ratio: 2 ratio (ref 0.0–4.4)
Cholesterol, Total: 134 mg/dL (ref 100–199)
Creatinine, Ser: 0.71 mg/dL (ref 0.57–1.00)
EOS (ABSOLUTE): 0.1 10*3/uL (ref 0.0–0.4)
Eos: 1 %
Estimated CHD Risk: <0.5 times avg. (ref 0.0–1.0)
Free Thyroxine Index: 1.7 (ref 1.2–4.9)
GGT: 8 IU/L (ref 0–60)
Globulin, Total: 2.4 g/dL (ref 1.5–4.5)
Glucose: 79 mg/dL (ref 70–99)
HDL: 68 mg/dL (ref >39)
Hematocrit: 42.9 % (ref 34.0–46.6)
Hemoglobin: 14.4 g/dL (ref 11.1–15.9)
Immature Grans (Abs): 0 10*3/uL (ref 0.0–0.1)
Immature Granulocytes: 0 %
Iron: 81 ug/dL (ref 27–139)
LDH: 192 IU/L (ref 119–226)
LDL Chol Calc (NIH): 54 mg/dL (ref 0–99)
Lymphocytes Absolute: 1 10*3/uL (ref 0.7–3.1)
Lymphs: 18 %
MCH: 33.3 pg — ABNORMAL HIGH (ref 26.6–33.0)
MCHC: 33.6 g/dL (ref 31.5–35.7)
MCV: 99 fL — ABNORMAL HIGH (ref 79–97)
Monocytes Absolute: 0.4 10*3/uL (ref 0.1–0.9)
Monocytes: 8 %
Neutrophils Absolute: 3.8 10*3/uL (ref 1.4–7.0)
Neutrophils: 72 %
Phosphorus: 3.1 mg/dL (ref 3.0–4.3)
Platelets: 265 10*3/uL (ref 150–450)
Potassium: 4.2 mmol/L (ref 3.5–5.2)
RBC: 4.33 x10E6/uL (ref 3.77–5.28)
RDW: 12 % (ref 11.7–15.4)
Sodium: 140 mmol/L (ref 134–144)
T3 Uptake Ratio: 26 % (ref 24–39)
T4, Total: 6.4 ug/dL (ref 4.5–12.0)
TSH: 1.71 u[IU]/mL (ref 0.450–4.500)
Total Protein: 6.5 g/dL (ref 6.0–8.5)
Triglycerides: 52 mg/dL (ref 0–149)
Uric Acid: 3.6 mg/dL (ref 3.0–7.2)
VLDL Cholesterol Cal: 12 mg/dL (ref 5–40)
WBC: 5.3 10*3/uL (ref 3.4–10.8)
eGFR: 93 mL/min/{1.73_m2} (ref >59)

## 2023-08-14 LAB — PARATHYROID HORMONE, INTACT (NO CA): PTH: 31 pg/mL (ref 15–65)

## 2023-08-14 LAB — VITAMIN D 25 HYDROXY (VIT D DEFICIENCY, FRACTURES): Vit D, 25-Hydroxy: 33.9 ng/mL (ref 30.0–100.0)

## 2024-03-27 ENCOUNTER — Other Ambulatory Visit: Payer: Self-pay

## 2024-03-27 DIAGNOSIS — M25559 Pain in unspecified hip: Secondary | ICD-10-CM | POA: Insufficient documentation

## 2024-03-27 DIAGNOSIS — S46919A Strain of unspecified muscle, fascia and tendon at shoulder and upper arm level, unspecified arm, initial encounter: Secondary | ICD-10-CM | POA: Insufficient documentation

## 2024-03-27 NOTE — Progress Notes (Signed)
 Reconciled Problem List
